# Patient Record
Sex: Female | Born: 1950 | Hispanic: No | Smoking: Never smoker
Health system: Southern US, Community
[De-identification: ages and names within clinical notes are randomized; demographics above are authoritative.]

## PROBLEM LIST (undated history)

## (undated) ENCOUNTER — Emergency Department: Payer: Medicare HMO

## (undated) DIAGNOSIS — M810 Age-related osteoporosis without current pathological fracture: Secondary | ICD-10-CM

## (undated) DIAGNOSIS — F329 Major depressive disorder, single episode, unspecified: Secondary | ICD-10-CM

## (undated) DIAGNOSIS — K219 Gastro-esophageal reflux disease without esophagitis: Secondary | ICD-10-CM

## (undated) DIAGNOSIS — H269 Unspecified cataract: Secondary | ICD-10-CM

## (undated) DIAGNOSIS — F32A Depression, unspecified: Secondary | ICD-10-CM

## (undated) DIAGNOSIS — E039 Hypothyroidism, unspecified: Secondary | ICD-10-CM

## (undated) DIAGNOSIS — E785 Hyperlipidemia, unspecified: Secondary | ICD-10-CM

## (undated) DIAGNOSIS — E079 Disorder of thyroid, unspecified: Secondary | ICD-10-CM

## (undated) HISTORY — PX: TUBAL LIGATION: SHX77

## (undated) HISTORY — PX: HERNIA REPAIR: SHX51

## (undated) HISTORY — PX: COLONOSCOPY: SHX174

## (undated) HISTORY — DX: Hyperlipidemia, unspecified: E78.5

## (undated) HISTORY — DX: Age-related osteoporosis without current pathological fracture: M81.0

---

## 1996-10-28 HISTORY — PX: BREAST BIOPSY: SHX20

## 1998-11-17 ENCOUNTER — Ambulatory Visit (HOSPITAL_COMMUNITY): Admission: RE | Admit: 1998-11-17 | Discharge: 1998-11-17 | Payer: Self-pay | Admitting: General Surgery

## 1998-11-17 ENCOUNTER — Encounter: Payer: Self-pay | Admitting: General Surgery

## 1999-05-29 ENCOUNTER — Encounter: Payer: Self-pay | Admitting: General Surgery

## 1999-05-29 ENCOUNTER — Ambulatory Visit (HOSPITAL_COMMUNITY): Admission: RE | Admit: 1999-05-29 | Discharge: 1999-05-29 | Payer: Self-pay | Admitting: General Surgery

## 2004-09-25 ENCOUNTER — Ambulatory Visit: Payer: Self-pay | Admitting: Internal Medicine

## 2005-10-24 ENCOUNTER — Ambulatory Visit: Payer: Self-pay | Admitting: Internal Medicine

## 2006-02-19 ENCOUNTER — Ambulatory Visit: Payer: Self-pay | Admitting: Obstetrics and Gynecology

## 2006-04-11 ENCOUNTER — Ambulatory Visit: Payer: Self-pay | Admitting: Unknown Physician Specialty

## 2006-11-13 ENCOUNTER — Emergency Department: Payer: Self-pay | Admitting: Unknown Physician Specialty

## 2006-11-14 ENCOUNTER — Other Ambulatory Visit: Payer: Self-pay

## 2007-01-12 ENCOUNTER — Ambulatory Visit: Payer: Self-pay | Admitting: Gastroenterology

## 2008-04-06 ENCOUNTER — Ambulatory Visit: Payer: Self-pay | Admitting: Obstetrics and Gynecology

## 2009-01-08 ENCOUNTER — Emergency Department: Payer: Self-pay

## 2009-04-11 ENCOUNTER — Ambulatory Visit: Payer: Self-pay | Admitting: Unknown Physician Specialty

## 2009-08-16 ENCOUNTER — Ambulatory Visit: Payer: Self-pay | Admitting: Podiatry

## 2010-06-05 ENCOUNTER — Ambulatory Visit: Payer: Self-pay | Admitting: Unknown Physician Specialty

## 2011-07-04 ENCOUNTER — Ambulatory Visit: Payer: Self-pay | Admitting: Unknown Physician Specialty

## 2012-05-06 ENCOUNTER — Ambulatory Visit: Payer: Self-pay | Admitting: Gastroenterology

## 2014-12-01 ENCOUNTER — Ambulatory Visit: Payer: Self-pay | Admitting: Internal Medicine

## 2015-12-13 ENCOUNTER — Other Ambulatory Visit: Payer: Self-pay | Admitting: Internal Medicine

## 2015-12-13 DIAGNOSIS — R1011 Right upper quadrant pain: Secondary | ICD-10-CM

## 2015-12-18 ENCOUNTER — Ambulatory Visit
Admission: RE | Admit: 2015-12-18 | Discharge: 2015-12-18 | Disposition: A | Discharge status: Home / Self Care | Payer: 59 | Source: Ambulatory Visit | Attending: Internal Medicine | Admitting: Internal Medicine

## 2015-12-18 DIAGNOSIS — R1011 Right upper quadrant pain: Secondary | ICD-10-CM | POA: Diagnosis present

## 2016-11-18 ENCOUNTER — Other Ambulatory Visit: Payer: Self-pay | Admitting: Certified Nurse Midwife

## 2016-11-18 DIAGNOSIS — Z1239 Encounter for other screening for malignant neoplasm of breast: Secondary | ICD-10-CM

## 2016-11-18 DIAGNOSIS — Z13820 Encounter for screening for osteoporosis: Secondary | ICD-10-CM

## 2016-12-30 ENCOUNTER — Ambulatory Visit
Admission: RE | Admit: 2016-12-30 | Discharge: 2016-12-30 | Disposition: A | Discharge status: Home / Self Care | Payer: 59 | Source: Ambulatory Visit | Attending: Certified Nurse Midwife | Admitting: Certified Nurse Midwife

## 2016-12-30 ENCOUNTER — Encounter: Payer: Self-pay | Admitting: Radiology

## 2016-12-30 DIAGNOSIS — Z13820 Encounter for screening for osteoporosis: Secondary | ICD-10-CM | POA: Insufficient documentation

## 2016-12-30 DIAGNOSIS — M810 Age-related osteoporosis without current pathological fracture: Secondary | ICD-10-CM

## 2016-12-30 DIAGNOSIS — Z1231 Encounter for screening mammogram for malignant neoplasm of breast: Secondary | ICD-10-CM | POA: Diagnosis not present

## 2016-12-30 DIAGNOSIS — M85851 Other specified disorders of bone density and structure, right thigh: Secondary | ICD-10-CM | POA: Diagnosis not present

## 2016-12-30 DIAGNOSIS — Z1239 Encounter for other screening for malignant neoplasm of breast: Secondary | ICD-10-CM

## 2016-12-30 HISTORY — DX: Age-related osteoporosis without current pathological fracture: M81.0

## 2017-01-01 ENCOUNTER — Encounter: Payer: Self-pay | Admitting: Certified Nurse Midwife

## 2017-01-02 NOTE — Progress Notes (Signed)
Left msg with Dr. Tonette Bihari office to see if Dexa results received. Awaiting response.

## 2017-01-02 NOTE — Progress Notes (Signed)
Contacted Dr. Tonette Bihari office to see if Dexa results were received. Left msg with receptionist to check with nurse. Awaiting response.

## 2017-01-06 NOTE — Progress Notes (Signed)
Thanks [employer]

## 2017-06-05 ENCOUNTER — Emergency Department
Admission: EM | Admit: 2017-06-05 | Discharge: 2017-06-05 | Disposition: A | Discharge status: Home / Self Care | Payer: 59 | Attending: Emergency Medicine | Admitting: Emergency Medicine

## 2017-06-05 ENCOUNTER — Encounter: Payer: Self-pay | Admitting: Medical Oncology

## 2017-06-05 ENCOUNTER — Emergency Department: Payer: 59

## 2017-06-05 DIAGNOSIS — Y999 Unspecified external cause status: Secondary | ICD-10-CM | POA: Insufficient documentation

## 2017-06-05 DIAGNOSIS — S01111A Laceration without foreign body of right eyelid and periocular area, initial encounter: Secondary | ICD-10-CM | POA: Insufficient documentation

## 2017-06-05 DIAGNOSIS — S0990XA Unspecified injury of head, initial encounter: Secondary | ICD-10-CM | POA: Diagnosis present

## 2017-06-05 DIAGNOSIS — S0083XA Contusion of other part of head, initial encounter: Secondary | ICD-10-CM

## 2017-06-05 DIAGNOSIS — S0181XA Laceration without foreign body of other part of head, initial encounter: Secondary | ICD-10-CM

## 2017-06-05 DIAGNOSIS — Y92009 Unspecified place in unspecified non-institutional (private) residence as the place of occurrence of the external cause: Secondary | ICD-10-CM | POA: Insufficient documentation

## 2017-06-05 DIAGNOSIS — S8002XA Contusion of left knee, initial encounter: Secondary | ICD-10-CM | POA: Diagnosis not present

## 2017-06-05 DIAGNOSIS — Y9301 Activity, walking, marching and hiking: Secondary | ICD-10-CM | POA: Diagnosis not present

## 2017-06-05 DIAGNOSIS — W01190A Fall on same level from slipping, tripping and stumbling with subsequent striking against furniture, initial encounter: Secondary | ICD-10-CM | POA: Insufficient documentation

## 2017-06-05 DIAGNOSIS — S01511A Laceration without foreign body of lip, initial encounter: Secondary | ICD-10-CM | POA: Diagnosis not present

## 2017-06-05 HISTORY — DX: Disorder of thyroid, unspecified: E07.9

## 2017-06-05 HISTORY — DX: Depression, unspecified: F32.A

## 2017-06-05 HISTORY — DX: Major depressive disorder, single episode, unspecified: F32.9

## 2017-06-05 HISTORY — DX: Gastro-esophageal reflux disease without esophagitis: K21.9

## 2017-06-05 MED ORDER — TRAMADOL HCL 50 MG PO TABS: 50.0000 mg | ORAL_TABLET | Freq: Once | ORAL | Status: DC

## 2017-06-05 MED ORDER — TRAMADOL HCL 50 MG PO TABS: 50.0000 mg | ORAL_TABLET | Freq: Four times a day (QID) | ORAL | 0 refills | Status: DC | PRN

## 2017-06-05 MED ORDER — IBUPROFEN 600 MG PO TABS: 600.0000 mg | ORAL_TABLET | Freq: Three times a day (TID) | ORAL | 0 refills | Status: DC | PRN

## 2017-06-05 MED ORDER — IBUPROFEN 800 MG PO TABS
800.0000 mg | ORAL_TABLET | Freq: Once | ORAL | Status: AC
  Administered 2017-06-05: 800 mg via ORAL
  Filled 2017-06-05: qty 1

## 2017-06-05 MED ORDER — OXYCODONE-ACETAMINOPHEN 5-325 MG PO TABS
1.0000 | ORAL_TABLET | Freq: Once | ORAL | Status: DC
  Filled 2017-06-05: qty 1

## 2017-06-05 NOTE — ED Provider Notes (Signed)
[facility] Emergency Department Provider Note   ____________________________________________   First MD Initiated Contact with Patient 06/05/17 [phone]     (approximate)  I have reviewed the triage vital signs and the nursing notes.   HISTORY  Chief Complaint Fall    HPI Heather Pacheco is a 66 y.o. female patient complaining of facial pain, right lower periorbital edema/laceration, left knee pain secondary to a slip and fall. Incident occurred this morning, she was taking a dog which slipped on some water. Patient denies LOC, vision disturbance, or vertigo. Patient rates the pain as a 10 over 10. No palliative measures for complaint. Patient described a pain as "achy".   Past Medical History:  Diagnosis Date  . Depression   . GERD (gastroesophageal reflux disease)   . Thyroid disease     There are no active problems to display for this patient.   Past Surgical History:  Procedure Laterality Date  . BREAST BIOPSY Left    Stereotactic biopsy    Prior to Admission medications   Medication Sig Start Date End Date Taking? Authorizing Provider  ibuprofen (ADVIL,MOTRIN) 600 MG tablet Take 1 tablet (600 mg total) by mouth every 8 (eight) hours as needed. 06/05/17   Sable Feil, PA-C  traMADol (ULTRAM) 50 MG tablet Take 1 tablet (50 mg total) by mouth every 6 (six) hours as needed for moderate pain. 06/05/17   Sable Feil, PA-C    Allergies Patient has no known allergies.  Family History  Problem Relation Age of Onset  . Breast cancer Sister 81    Social History Social History  Substance Use Topics  . Smoking status: Not on file  . Smokeless tobacco: Not on file  . Alcohol use Not on file    Review of Systems  Constitutional: No fever/chills Eyes: No visual changes. ENT: No sore throat. Cardiovascular: Denies chest pain. Respiratory: Denies shortness of breath. Gastrointestinal: No abdominal pain.  No nausea, no vomiting.  No  diarrhea.  No constipation. Genitourinary: Negative for dysuria. Musculoskeletal: Left knee pain  Skin: Negative for rash. Right lower periorbital laceration and swelling. Left lower internal lip laceration Neurological: Negative for headaches, focal weakness or numbness. Psychiatric: Depression Endocrine:Hypothyroidism   ____________________________________________   PHYSICAL EXAM:  VITAL SIGNS: ED Triage Vitals  Enc Vitals Group     BP 06/05/17 0729 139/83     Pulse Rate 06/05/17 0729 70     Resp 06/05/17 0729 16     Temp 06/05/17 0729 98.4 F (36.9 C)     Temp Source 06/05/17 0729 Oral     SpO2 06/05/17 0729 97 %     Weight 06/05/17 0730 171 lb (77.6 kg)     Height 06/05/17 0730 5\' 6"  (1.676 m)     Head Circumference --      Peak Flow --      Pain Score 06/05/17 0729 10     Pain Loc --      Pain Edu? --      Excl. in [place]? --     Constitutional: Alert and oriented. Well appearing and in no acute distress. Eyes: Conjunctivae are normal. PERRL. EOMI. Head: Atraumatic. Nose: No congestion/rhinnorhea. Mouth/Throat: Mucous membranes are moist.  Oropharynx non-erythematous. Lower inner lip laceration Neck: No stridor.  No cervical spine tenderness to palpation. Cardiovascular: Normal rate, regular rhythm. Grossly normal heart sounds.  Good peripheral circulation. Respiratory: Normal respiratory effort.  No retractions. Lungs CTAB. Musculoskeletal: No lower extremity tenderness nor  edema.  No joint effusions. Neurologic:  Normal speech and language. No gross focal neurologic deficits are appreciated. No gait instability. Skin:  Skin is warm, dry and intact. No rash noted. Psychiatric: Mood and affect are normal. Speech and behavior are normal.  ____________________________________________   LABS (all labs ordered are listed, but only abnormal results are displayed)  Labs Reviewed - No data to  display ____________________________________________  EKG   ____________________________________________  RADIOLOGY  Dg Facial Bones Complete  Result Date: 06/05/2017 CLINICAL DATA:  Pain following fall EXAM: FACIAL BONES COMPLETE 3+V COMPARISON:  None. FINDINGS: Delsa Sale, lateral, and submentaovertex images were obtained. There is no demonstrable fracture or dislocation. Paranasal sinuses and mastoids appear clear. There is minimal leftward deviation of the upper nasal septum. IMPRESSION: Minimal leftward deviation of the upper nasal septum. No evident fracture or dislocation. Paranasal sinuses and mastoids are clear. If there is concern for intracranial pathology, noncontrast enhanced head CT would be the imaging study of choice for further assessment. Electronically Signed   By: Lowella Grip III M.D.   On: 06/05/2017 09:02   Dg Knee Complete 4 Views Left  Result Date: 06/05/2017 CLINICAL DATA:  Pain following fall EXAM: LEFT KNEE - COMPLETE 4+ VIEW COMPARISON:  None. FINDINGS: Frontal, lateral common bile oblique views were obtained. There is marked soft tissue prominence anterior to the patella. No soft tissue air in this area. There is no evident fracture or dislocation. There is no appreciable joint effusion. There is mild narrowing of the patellofemoral joint. Other joint spaces appear normal. No erosive change. IMPRESSION: Marked soft tissue prominence anterior to the patella. Suspect hematoma in this area. No soft tissue air. No fracture or dislocation. No joint effusion. Mild narrowing of the patellofemoral joint noted. Electronically Signed   By: Lowella Grip III M.D.   On: 06/05/2017 09:03    _X-ray of the left knee remarkable for hematoma. ___________________________________________   PROCEDURES  Procedure(s) performed: None  Procedures  Critical Care performed: No  ____________________________________________   INITIAL IMPRESSION / ASSESSMENT AND PLAN /  ED COURSE  Pertinent labs & imaging results that were available during my care of the patient were reviewed by me and considered in my medical decision making (see chart for details).  Facial contusion and laceration. Left inner lip laceration. Left knee hematoma secondary to contusion. Discussed x-ray findings with patient. Patient given discharge care instructions. Patient advised take medication as needed. Patient given a work note. Patient advised to follow with PCP if condition persists.      ____________________________________________   FINAL CLINICAL IMPRESSION(S) / ED DIAGNOSES  Final diagnoses:  Contusion of face, initial encounter  Facial laceration, initial encounter  Lip laceration, initial encounter  Traumatic hematoma of left knee, initial encounter      NEW MEDICATIONS STARTED DURING THIS VISIT:  New Prescriptions   IBUPROFEN (ADVIL,MOTRIN) 600 MG TABLET    Take 1 tablet (600 mg total) by mouth every 8 (eight) hours as needed.   TRAMADOL (ULTRAM) 50 MG TABLET    Take 1 tablet (50 mg total) by mouth every 6 (six) hours as needed for moderate pain.     Note:  This document was prepared using Dragon voice recognition software and may include unintentional dictation errors.    Sable Feil, PA-C 06/05/17 4742    Nance Pear, MD 06/05/17 [phone]

## 2017-06-05 NOTE — ED Notes (Signed)
Pt to ed with c/o fall this am at home. Pt reports she slipped on water on the floor and then her face hit the table, and left knee hit the floor.  Small scratch/abrasion to right eye and left lower lip swelling.  Also swelling noted to left knee.

## 2017-06-05 NOTE — Discharge Instructions (Signed)
Final discharge care instructions. Innerr lip laceration did not require sutures. Advised warm salt water gargles 2-3 times a day.

## 2017-06-05 NOTE — ED Notes (Signed)
Pt verbalizes understanding of discharge instruction and follow up.

## 2017-06-05 NOTE — ED Triage Notes (Signed)
Pt ambulatory to triage with reports that she got up this am to take her dog out and slipped on some water. Pt hit her rt eye, lower lip and left knee. Denies LOC. No use of blood thinners.

## 2017-11-06 ENCOUNTER — Ambulatory Visit: Admit: 2017-11-06 | Payer: 59 | Admitting: Gastroenterology

## 2017-11-06 SURGERY — COLONOSCOPY WITH PROPOFOL
Anesthesia: General

## 2018-01-02 ENCOUNTER — Encounter: Payer: Self-pay | Admitting: Certified Nurse Midwife

## 2018-01-02 ENCOUNTER — Ambulatory Visit (INDEPENDENT_AMBULATORY_CARE_PROVIDER_SITE_OTHER): Payer: Medicare HMO | Admitting: Certified Nurse Midwife

## 2018-01-02 VITALS — BP 112/66 | HR 67 | Ht 65.0 in | Wt 177.0 lb

## 2018-01-02 DIAGNOSIS — Z1231 Encounter for screening mammogram for malignant neoplasm of breast: Secondary | ICD-10-CM | POA: Diagnosis not present

## 2018-01-02 DIAGNOSIS — K219 Gastro-esophageal reflux disease without esophagitis: Secondary | ICD-10-CM | POA: Insufficient documentation

## 2018-01-02 DIAGNOSIS — E079 Disorder of thyroid, unspecified: Secondary | ICD-10-CM | POA: Insufficient documentation

## 2018-01-02 DIAGNOSIS — Z01419 Encounter for gynecological examination (general) (routine) without abnormal findings: Secondary | ICD-10-CM | POA: Diagnosis not present

## 2018-01-02 DIAGNOSIS — E785 Hyperlipidemia, unspecified: Secondary | ICD-10-CM | POA: Insufficient documentation

## 2018-01-02 DIAGNOSIS — F329 Major depressive disorder, single episode, unspecified: Secondary | ICD-10-CM | POA: Insufficient documentation

## 2018-01-02 DIAGNOSIS — Z1239 Encounter for other screening for malignant neoplasm of breast: Secondary | ICD-10-CM

## 2018-01-02 DIAGNOSIS — Z124 Encounter for screening for malignant neoplasm of cervix: Secondary | ICD-10-CM

## 2018-01-02 DIAGNOSIS — F32A Depression, unspecified: Secondary | ICD-10-CM | POA: Insufficient documentation

## 2018-01-02 NOTE — Progress Notes (Signed)
Gynecology Annual Exam  PCP: Kirk Ruths, MD  Chief Complaint:  Chief Complaint  Patient presents with  . Gynecologic Exam    History of Present Illness:Heather Pacheco is a 67 year old Caucasian/White female, G4 P2022, who presents for her annual exam. She is having no significant GYN problems.  Her menses are absent and she is postmenopausal. She does not have hot flashes.  She has had no spotting.   The patient's past medical history is notable for a history of depression, hypothyroidism, hyperlipidemia, and GERD.  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 She has also retired from her [employer] job.  She is not sexually active. She has been sexually active in the past but not currently.  Her most recent pap smear was obtained 11/18/2016 and was NIL Her most recent mammogram obtained on 12/30/2016 was normal and revealed no significant changes.  There is a family history of breast cancer in her sister. Genetic testing has not been done There is no family history of ovarian cancer.  The patient does do monthly self breast exams.  She had a colonoscopy November 2018 ago that was normal. Her next colonoscopy is due in 5 years.   She had a recent DEXA scan 12/30/2016 revealing osteoporosis of the spine with a T score of -2.7. Her PCP Dr Ouida Sills, her PCP, placed her on Boniva.  The patient does not smoke.  The patient does not drink alcohol.  The patient does not use illegal drugs.  The patient exercises 3x/[facility] at the [facility]. The patient does get adequate calcium in her diet.  She had a recent cholesterol screen in 2019 that was abnormal. She had an elevated TC and LDL.    Review of Systems: Review of Systems  Constitutional: Negative for chills, fever and weight loss.  HENT: Negative for congestion, sinus pain and sore throat.        Positive for sneezing  Eyes: Negative for blurred vision and pain.    Respiratory: Negative for hemoptysis, shortness of breath and wheezing.   Cardiovascular: Negative for chest pain, palpitations and leg swelling.  Gastrointestinal: Negative for abdominal pain, blood in stool, diarrhea, heartburn, nausea and vomiting.  Genitourinary: Negative for dysuria, frequency, hematuria and urgency.  Musculoskeletal: Negative for back pain, joint pain and myalgias.  Skin: Negative for itching and rash.  Neurological: Negative for dizziness, tingling and headaches.  Endo/Heme/Allergies: Positive for environmental allergies. Negative for polydipsia. Does not bruise/bleed easily.       Negative for hirsutism   Psychiatric/Behavioral: Positive for depression. The patient is not nervous/anxious and does not have insomnia.     Past Medical History:  Past Medical History:  Diagnosis Date  . Depression   . GERD (gastroesophageal reflux disease)   . Hyperlipidemia   . 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 Thyroid disease     Past Surgical History:  Past Surgical History:  Procedure Laterality Date  . BREAST BIOPSY Left 1998   Stereotactic biopsy benign  . COLONOSCOPY  2008; 08/2017   normal colon   . TUBAL LIGATION      Family History:  Family History  Problem Relation Age of Onset  . Breast cancer Sister 74  . Colon cancer Mother 58       rectal cancer  . Hypertension Mother   . Diabetes Maternal Aunt 40    Social History:  Social History   Socioeconomic History  . Marital status: Divorced    Spouse name: Not on file  . Number of children: 2  . Years of education: Not on file  . Highest education level: Not on file  Social Needs  . Financial resource strain: Not on file  . Food insecurity - worry: Not on file  . Food insecurity - inability: Not on file  . Transportation needs - medical: Not on file  . Transportation needs - non-medical: Not on file  Occupational History  . Occupation: Retired from [employer] 2019  Tobacco Use   . Smoking status: Never Smoker  . Smokeless tobacco: Never Used  Substance and Sexual Activity  . Alcohol use: No    Frequency: Never  . Drug use: No  . Sexual activity: Not Currently    Birth control/protection: Post-menopausal  Other Topics Concern  . Not on file  Social History Narrative  . Not on file    Allergies:  No Known Allergies  Medications:  Current Outpatient Medications on File Prior to Visit  Medication Sig Dispense Refill  . azelastine (OPTIVAR) 0.05 % ophthalmic solution INSTILL ONE DROP IN BOTH  EYES TWO TIMES DAILY    . cetirizine (ZYRTEC) 10 MG tablet Take by mouth.    . chlorhexidine (PERIDEX) 0.12 % solution     . Cholecalciferol (VITAMIN D) 2000 units tablet Take by mouth.    . citalopram (CELEXA) 20 MG tablet Take 30 mg by mouth daily.     . fluticasone (FLONASE) 50 MCG/ACT nasal spray     . ibandronate (BONIVA) 150 MG tablet TAKE 1 TABLET EVERY 30  DAYS. TAKE WITH A FULL  GLASS OF WATER IN THE  MORNING . DO NOT Pacheco DOWN  FOR THE NEXT 60 MIN.    Marland Kitchen ibuprofen (ADVIL,MOTRIN) 600 MG tablet Take 1 tablet (600 mg total) by mouth every 8 (eight) hours as needed. 15 tablet 0  . levothyroxine (SYNTHROID, LEVOTHROID) 88 MCG tablet     . Multiple Vitamin (MULTIVITAMIN) tablet Take 1 tablet by mouth daily.    Marland Kitchen omeprazole (PRILOSEC) 20 MG capsule TAKE 1 CAPSULE BY MOUTH  ONCE A DAY    . PREVNAR 13 SUSP injection ADM 0.5ML IM UTD  0   No current facility-administered medications on file prior to visit.    Physical Exam Vitals: BP 112/66   Pulse 67   Ht 5\' 5"  (1.651 m)   Wt 177 lb (80.3 kg)   BMI 29.45 kg/m    General: WF in NAD HEENT: normocephalic, anicteric Neck: no thyroid enlargement, no palpable nodules, no cervical lymphadenopathy  Pulmonary: No increased work of breathing, CTAB Cardiovascular: RRR, without murmur  Breast: Breast symmetrical, no tenderness, no palpable nodules or masses, no skin or nipple retraction present, no nipple discharge.  No  axillary, infraclavicular or supraclavicular lymphadenopathy. Abdomen: Soft, non-tender, non-distended.  Umbilicus without lesions.  No hepatomegaly or masses palpable. No evidence of hernia. Genitourinary:  External: Normal external female genitalia.  Normal urethral meatus, normal Bartholin's and Skene's glands.    Vagina: Normal vaginal mucosa, no evidence of prolapse.    Cervix: Grossly normal in appearance, no bleeding, non-tender  Uterus: Retroverted, normal size, shape, and consistency, mobile, and non-tender  Adnexa: No adnexal masses, non-tender  Rectal: deferred  Lymphatic: no evidence of inguinal lymphadenopathy Extremities: no edema, erythema, or tenderness Neurologic: Grossly intact Psychiatric: mood appropriate, affect full     Assessment: 67 y.o. annual gyn exam  Plan:  1) Breast cancer screening - recommend monthly self breast exam and annual screening mammogram. Mammogram was ordered today.  2) Colon cancer screening: colonoscopy 08/2017 was normal  3) Cervical cancer screening - Pap was done. ASCCP guidelines and rational discussed.  Patient opts for every 2 years screening interval   4) Osteoporosis of spine: recommend repeat Dexa next year. Continue Boniva/ vitamin D3 supplements and exercise.  5) Routine healthcare maintenance including cholesterol and diabetes screening managed by PCP  Dalia Heading, CNM

## 2018-01-05 LAB — PAP IG (IMAGE GUIDED): PAP Smear Comment: 0

## 2018-01-23 ENCOUNTER — Ambulatory Visit
Admission: RE | Admit: 2018-01-23 | Discharge: 2018-01-23 | Disposition: A | Discharge status: Home / Self Care | Payer: Medicare HMO | Source: Ambulatory Visit | Attending: Certified Nurse Midwife | Admitting: Certified Nurse Midwife

## 2018-01-23 DIAGNOSIS — Z1231 Encounter for screening mammogram for malignant neoplasm of breast: Secondary | ICD-10-CM | POA: Insufficient documentation

## 2018-01-23 DIAGNOSIS — Z1239 Encounter for other screening for malignant neoplasm of breast: Secondary | ICD-10-CM

## 2018-04-28 ENCOUNTER — Telehealth: Payer: Self-pay

## 2018-04-28 NOTE — Telephone Encounter (Signed)
PLease have her call her PCP. Dr Ouida Sills started her on the Boniva.

## 2018-04-28 NOTE — Telephone Encounter (Signed)
boniva rx too expensive, please advise for alternative. Thank you!

## 2018-04-28 NOTE — Telephone Encounter (Signed)
Pt states the Ibangronate cost too much its 300 dollars. Is there anything else that could be called in?

## 2018-04-29 NOTE — Telephone Encounter (Signed)
Pt aware and plans to contact PCP

## 2019-03-18 ENCOUNTER — Other Ambulatory Visit: Payer: Self-pay | Admitting: Internal Medicine

## 2019-03-18 DIAGNOSIS — Z1231 Encounter for screening mammogram for malignant neoplasm of breast: Secondary | ICD-10-CM

## 2019-04-05 ENCOUNTER — Other Ambulatory Visit: Payer: Self-pay

## 2019-04-05 ENCOUNTER — Ambulatory Visit
Admission: RE | Admit: 2019-04-05 | Discharge: 2019-04-05 | Disposition: A | Discharge status: Home / Self Care | Payer: Medicare HMO | Source: Ambulatory Visit | Attending: Internal Medicine | Admitting: Internal Medicine

## 2019-04-05 DIAGNOSIS — Z1231 Encounter for screening mammogram for malignant neoplasm of breast: Secondary | ICD-10-CM | POA: Diagnosis present

## 2019-07-15 ENCOUNTER — Other Ambulatory Visit: Payer: Self-pay

## 2019-07-15 ENCOUNTER — Encounter: Payer: Self-pay | Admitting: *Deleted

## 2019-07-15 NOTE — Discharge Instructions (Signed)

## 2019-07-16 ENCOUNTER — Other Ambulatory Visit
Admission: RE | Admit: 2019-07-16 | Discharge: 2019-07-16 | Disposition: A | Discharge status: Home / Self Care | Payer: Medicare HMO | Source: Ambulatory Visit | Attending: Ophthalmology | Admitting: Ophthalmology

## 2019-07-16 DIAGNOSIS — Z20828 Contact with and (suspected) exposure to other viral communicable diseases: Secondary | ICD-10-CM | POA: Diagnosis not present

## 2019-07-16 DIAGNOSIS — Z01812 Encounter for preprocedural laboratory examination: Secondary | ICD-10-CM | POA: Insufficient documentation

## 2019-07-17 LAB — SARS CORONAVIRUS 2 (TAT 6-24 HRS): SARS Coronavirus 2: NEGATIVE

## 2019-07-19 NOTE — Anesthesia Preprocedure Evaluation (Addendum)
Anesthesia Evaluation  Patient identified by MRN, date of birth, ID band Patient awake    Reviewed: Allergy & Precautions, NPO status , Patient's Chart, lab work & pertinent test results  History of Anesthesia Complications Negative for: history of anesthetic complications  Airway Mallampati: I   Neck ROM: Full    Dental no notable dental hx.    Pulmonary neg pulmonary ROS,    Pulmonary exam normal breath sounds clear to auscultation       Cardiovascular Exercise Tolerance: Good negative cardio ROS Normal cardiovascular exam Rhythm:Regular Rate:Normal     Neuro/Psych PSYCHIATRIC DISORDERS Depression negative neurological ROS     GI/Hepatic GERD  ,  Endo/Other  Hypothyroidism   Renal/GU negative Renal ROS     Musculoskeletal   Abdominal   Peds  Hematology negative hematology ROS (+)   Anesthesia Other Findings   Reproductive/Obstetrics                            Anesthesia Physical Anesthesia Plan  ASA: II  Anesthesia Plan: MAC   Post-op Pain Management:    Induction: Intravenous  PONV Risk Score and Plan: 2 and TIVA  Airway Management Planned: Natural Airway  Additional Equipment:   Intra-op Plan:   Post-operative Plan:   Informed Consent: I have reviewed the patients History and Physical, chart, labs and discussed the procedure including the risks, benefits and alternatives for the proposed anesthesia with the patient or authorized representative who has indicated his/her understanding and acceptance.       Plan Discussed with: CRNA  Anesthesia Plan Comments:        Anesthesia Quick Evaluation

## 2019-07-21 ENCOUNTER — Ambulatory Visit
Admission: RE | Admit: 2019-07-21 | Discharge: 2019-07-21 | Disposition: A | Discharge status: Home / Self Care | Payer: Medicare HMO | Attending: Ophthalmology | Admitting: Ophthalmology

## 2019-07-21 ENCOUNTER — Ambulatory Visit: Payer: Medicare HMO | Admitting: Anesthesiology

## 2019-07-21 ENCOUNTER — Other Ambulatory Visit: Payer: Self-pay

## 2019-07-21 ENCOUNTER — Encounter
Admission: RE | Disposition: A | Discharge status: Home / Self Care | Payer: Self-pay | Source: Home / Self Care | Attending: Ophthalmology

## 2019-07-21 DIAGNOSIS — E039 Hypothyroidism, unspecified: Secondary | ICD-10-CM | POA: Insufficient documentation

## 2019-07-21 DIAGNOSIS — K219 Gastro-esophageal reflux disease without esophagitis: Secondary | ICD-10-CM | POA: Insufficient documentation

## 2019-07-21 DIAGNOSIS — H2512 Age-related nuclear cataract, left eye: Secondary | ICD-10-CM | POA: Insufficient documentation

## 2019-07-21 DIAGNOSIS — E7800 Pure hypercholesterolemia, unspecified: Secondary | ICD-10-CM | POA: Diagnosis not present

## 2019-07-21 DIAGNOSIS — Z79899 Other long term (current) drug therapy: Secondary | ICD-10-CM | POA: Diagnosis not present

## 2019-07-21 DIAGNOSIS — M7989 Other specified soft tissue disorders: Secondary | ICD-10-CM | POA: Insufficient documentation

## 2019-07-21 DIAGNOSIS — M810 Age-related osteoporosis without current pathological fracture: Secondary | ICD-10-CM | POA: Diagnosis not present

## 2019-07-21 DIAGNOSIS — F329 Major depressive disorder, single episode, unspecified: Secondary | ICD-10-CM | POA: Insufficient documentation

## 2019-07-21 HISTORY — PX: CATARACT EXTRACTION W/PHACO: SHX586

## 2019-07-21 SURGERY — PHACOEMULSIFICATION, CATARACT, WITH IOL INSERTION
Anesthesia: Monitor Anesthesia Care | Site: Eye | Laterality: Left

## 2019-07-21 MED ORDER — EPINEPHRINE PF 1 MG/ML IJ SOLN
INTRAOCULAR | Status: DC | PRN
  Administered 2019-07-21: 60 mL via OPHTHALMIC

## 2019-07-21 MED ORDER — ONDANSETRON HCL 4 MG/2ML IJ SOLN: 4.0000 mg | Freq: Once | INTRAMUSCULAR | Status: DC | PRN

## 2019-07-21 MED ORDER — MIDAZOLAM HCL 2 MG/2ML IJ SOLN
INTRAMUSCULAR | Status: DC | PRN
  Administered 2019-07-21: 2 mg via INTRAVENOUS

## 2019-07-21 MED ORDER — CEFUROXIME OPHTHALMIC INJECTION 1 MG/0.1 ML
INJECTION | OPHTHALMIC | Status: DC | PRN
  Administered 2019-07-21: 0.1 mL via INTRACAMERAL

## 2019-07-21 MED ORDER — ACETAMINOPHEN 10 MG/ML IV SOLN: 1000.0000 mg | Freq: Once | INTRAVENOUS | Status: DC | PRN

## 2019-07-21 MED ORDER — TETRACAINE HCL 0.5 % OP SOLN
1.0000 [drp] | OPHTHALMIC | Status: DC | PRN
  Administered 2019-07-21 (×3): 1 [drp] via OPHTHALMIC

## 2019-07-21 MED ORDER — ARMC OPHTHALMIC DILATING DROPS
1.0000 "application " | OPHTHALMIC | Status: DC | PRN
  Administered 2019-07-21 (×3): 1 via OPHTHALMIC

## 2019-07-21 MED ORDER — MOXIFLOXACIN HCL 0.5 % OP SOLN
1.0000 [drp] | OPHTHALMIC | Status: DC | PRN
  Administered 2019-07-21 (×3): 1 [drp] via OPHTHALMIC

## 2019-07-21 MED ORDER — FENTANYL CITRATE (PF) 100 MCG/2ML IJ SOLN
INTRAMUSCULAR | Status: DC | PRN
  Administered 2019-07-21: 50 ug via INTRAVENOUS

## 2019-07-21 MED ORDER — LIDOCAINE HCL (PF) 2 % IJ SOLN
INTRAOCULAR | Status: DC | PRN
  Administered 2019-07-21: 1 mL

## 2019-07-21 MED ORDER — BRIMONIDINE TARTRATE-TIMOLOL 0.2-0.5 % OP SOLN
OPHTHALMIC | Status: DC | PRN
  Administered 2019-07-21: 1 [drp] via OPHTHALMIC

## 2019-07-21 MED ORDER — NA HYALUR & NA CHOND-NA HYALUR 0.4-0.35 ML IO KIT
PACK | INTRAOCULAR | Status: DC | PRN
  Administered 2019-07-21: 1 mL via INTRAOCULAR

## 2019-07-21 SURGICAL SUPPLY — 16 items
CANNULA ANT/CHMB 27G (MISCELLANEOUS) ×1 IMPLANT
CANNULA ANT/CHMB 27GA (MISCELLANEOUS) ×2 IMPLANT
GLOVE SURG LX 7.5 STRW (GLOVE) ×1
GLOVE SURG LX STRL 7.5 STRW (GLOVE) ×1 IMPLANT
GLOVE SURG TRIUMPH 8.0 PF LTX (GLOVE) ×2 IMPLANT
GOWN STRL REUS W/ TWL LRG LVL3 (GOWN DISPOSABLE) ×2 IMPLANT
GOWN STRL REUS W/TWL LRG LVL3 (GOWN DISPOSABLE) ×2
LENS IOL TECNIS ITEC 20.5 (Intraocular Lens) ×1 IMPLANT
MARKER SKIN DUAL TIP RULER LAB (MISCELLANEOUS) ×2 IMPLANT
PACK CATARACT BRASINGTON (MISCELLANEOUS) ×2 IMPLANT
PACK EYE AFTER SURG (MISCELLANEOUS) ×2 IMPLANT
PACK OPTHALMIC (MISCELLANEOUS) ×2 IMPLANT
SYR 3ML LL SCALE MARK (SYRINGE) ×2 IMPLANT
SYR TB 1ML LUER SLIP (SYRINGE) ×2 IMPLANT
WATER STERILE IRR 500ML POUR (IV SOLUTION) ×2 IMPLANT
WIPE NON LINTING 3.25X3.25 (MISCELLANEOUS) ×2 IMPLANT

## 2019-07-21 NOTE — Anesthesia Postprocedure Evaluation (Signed)
Anesthesia Post Note  Patient: Heather Pacheco  Procedure(s) Performed: CATARACT EXTRACTION PHACO AND INTRAOCULAR LENS PLACEMENT (IOC) LEFT  00:50.7  11.0%  5.60 (Left Eye)  Patient location during evaluation: PACU Anesthesia Type: MAC Level of consciousness: awake and alert, oriented and patient cooperative Pain management: pain level controlled Vital Signs Assessment: post-procedure vital signs reviewed and stable Respiratory status: spontaneous breathing, nonlabored ventilation and respiratory function stable Cardiovascular status: blood pressure returned to baseline and stable Postop Assessment: adequate PO intake Anesthetic complications: no    Darrin Nipper

## 2019-07-21 NOTE — Op Note (Signed)
OPERATIVE NOTE  Heather Pacheco [id number] 07/21/2019   PREOPERATIVE DIAGNOSIS:  Nuclear sclerotic cataract left eye. H25.12   POSTOPERATIVE DIAGNOSIS:    Nuclear sclerotic cataract left eye.     PROCEDURE:  Phacoemusification with posterior chamber intraocular lens placement of the left eye  Ultrasound time: Procedure(s) with comments: CATARACT EXTRACTION PHACO AND INTRAOCULAR LENS PLACEMENT (IOC) LEFT  00:50.7  11.0%  5.60 (Left) - LEAVE PATIENT 3RD BECAUSE OF TRANSPORTATION  LENS:   Implant Name Type Inv. Item Serial No. Manufacturer Lot No. LRB No. Used Action  LENS IOL DIOP 20.5 - [id number] Intraocular Lens LENS IOL DIOP 20.5 [id number]  Left 1 Implanted      SURGEON:  Wyonia Hough, MD   ANESTHESIA:  Topical with tetracaine drops and 2% Xylocaine jelly, augmented with 1% preservative-free intracameral lidocaine.    COMPLICATIONS:  None.   DESCRIPTION OF PROCEDURE:  The patient was identified in the holding room and transported to the operating room and placed in the supine position under the operating microscope.  The left eye was identified as the operative eye and it was prepped and draped in the usual sterile ophthalmic fashion.   A 1 millimeter clear-corneal paracentesis was made at the 1:30 position.  0.5 ml of preservative-free 1% lidocaine was injected into the anterior chamber.  The anterior chamber was filled with Viscoat viscoelastic.  A 2.4 millimeter keratome was used to make a near-clear corneal incision at the 10:30 position.  .  A curvilinear capsulorrhexis was made with a cystotome and capsulorrhexis forceps.  Balanced salt solution was used to hydrodissect and hydrodelineate the nucleus.   Phacoemulsification was then used in stop and chop fashion to remove the lens nucleus and epinucleus.  The remaining cortex was then removed using the irrigation and aspiration handpiece. Provisc was then placed into the capsular bag to distend it for lens  placement.  A lens was then injected into the capsular bag.  The remaining viscoelastic was aspirated.   Wounds were hydrated with balanced salt solution.  The anterior chamber was inflated to a physiologic pressure with balanced salt solution.  No wound leaks were noted. Cefuroxime 0.1 ml of a 10mg /ml solution was injected into the anterior chamber for a dose of 1 mg of intracameral antibiotic at the completion of the case.   Timolol and Brimonidine drops were applied to the eye.  The patient was taken to the recovery room in stable condition without complications of anesthesia or surgery.  [name] 07/21/2019, 8:56 AM

## 2019-07-21 NOTE — H&P (Signed)

## 2019-07-21 NOTE — Transfer of Care (Signed)
Immediate Anesthesia Transfer of Care Note  Patient: Heather Pacheco  Procedure(s) Performed: CATARACT EXTRACTION PHACO AND INTRAOCULAR LENS PLACEMENT (IOC) LEFT  00:50.7  11.0%  5.60 (Left Eye)  Patient Location: PACU  Anesthesia Type: MAC  Level of Consciousness: awake, alert  and patient cooperative  Airway and Oxygen Therapy: Patient Spontanous Breathing and Patient connected to supplemental oxygen  Post-op Assessment: Post-op Vital signs reviewed, Patient's Cardiovascular Status Stable, Respiratory Function Stable, Patent Airway and No signs of Nausea or vomiting  Post-op Vital Signs: Reviewed and stable  Complications: No apparent anesthesia complications

## 2019-09-15 ENCOUNTER — Ambulatory Visit: Admit: 2019-09-15 | Payer: Medicare HMO | Admitting: Ophthalmology

## 2019-09-15 SURGERY — PHACOEMULSIFICATION, CATARACT, WITH IOL INSERTION
Anesthesia: Topical | Laterality: Right

## 2019-12-06 ENCOUNTER — Ambulatory Visit: Payer: Medicare HMO | Attending: Internal Medicine

## 2019-12-06 DIAGNOSIS — Z23 Encounter for immunization: Secondary | ICD-10-CM | POA: Insufficient documentation

## 2019-12-06 NOTE — Progress Notes (Signed)
   Covid-19 Vaccination Clinic  Name:  Heather Pacheco    MRN: [id number] DOB: 1951-02-16  12/06/2019  [name] was observed post Covid-19 immunization for 15 minutes without incidence. She was provided with Vaccine Information Sheet and instruction to access the V-Safe system.   Ms. August was instructed to call 911 with any severe reactions post vaccine: Marland Kitchen Difficulty breathing  . Swelling of your face and throat  . A fast heartbeat  . A bad rash all over your body  . Dizziness and weakness    Immunizations Administered    Name Date Dose VIS Date Route   Pfizer COVID-19 Vaccine 12/06/2019 10:53 AM 0.3 mL 10/08/2019 Intramuscular   Manufacturer: Home   Lot: [id number]   [place]: [id number]

## 2019-12-31 ENCOUNTER — Ambulatory Visit: Payer: Medicare HMO

## 2020-02-23 ENCOUNTER — Encounter: Payer: Self-pay | Admitting: Ophthalmology

## 2020-02-23 ENCOUNTER — Other Ambulatory Visit: Payer: Self-pay

## 2020-02-26 ENCOUNTER — Encounter: Payer: Self-pay | Admitting: Emergency Medicine

## 2020-02-26 ENCOUNTER — Other Ambulatory Visit: Payer: Self-pay

## 2020-02-26 ENCOUNTER — Ambulatory Visit
Admission: EM | Admit: 2020-02-26 | Discharge: 2020-02-26 | Disposition: A | Discharge status: Home / Self Care | Payer: Medicare HMO | Attending: Family Medicine | Admitting: Family Medicine

## 2020-02-26 DIAGNOSIS — E785 Hyperlipidemia, unspecified: Secondary | ICD-10-CM | POA: Diagnosis not present

## 2020-02-26 DIAGNOSIS — M810 Age-related osteoporosis without current pathological fracture: Secondary | ICD-10-CM | POA: Diagnosis not present

## 2020-02-26 DIAGNOSIS — Z79890 Hormone replacement therapy (postmenopausal): Secondary | ICD-10-CM | POA: Diagnosis not present

## 2020-02-26 DIAGNOSIS — F329 Major depressive disorder, single episode, unspecified: Secondary | ICD-10-CM | POA: Diagnosis not present

## 2020-02-26 DIAGNOSIS — J069 Acute upper respiratory infection, unspecified: Secondary | ICD-10-CM | POA: Diagnosis not present

## 2020-02-26 DIAGNOSIS — Z79899 Other long term (current) drug therapy: Secondary | ICD-10-CM | POA: Diagnosis not present

## 2020-02-26 DIAGNOSIS — E039 Hypothyroidism, unspecified: Secondary | ICD-10-CM | POA: Insufficient documentation

## 2020-02-26 DIAGNOSIS — Z20822 Contact with and (suspected) exposure to covid-19: Secondary | ICD-10-CM | POA: Insufficient documentation

## 2020-02-26 DIAGNOSIS — K219 Gastro-esophageal reflux disease without esophagitis: Secondary | ICD-10-CM | POA: Diagnosis not present

## 2020-02-26 HISTORY — DX: Unspecified cataract: H26.9

## 2020-02-26 NOTE — ED Triage Notes (Signed)
Patient c/o runny nose, cough and bodyaches for the past week.

## 2020-02-26 NOTE — ED Provider Notes (Signed)
[facility]    CSN: [id number] Arrival date & time: 02/26/20  1545      History   Chief Complaint Chief Complaint  Patient presents with  . Generalized Body Aches  . Nasal Congestion    HPI Heather Pacheco is a 69 y.o. female.   HPI  69 year old female presents with a 8-day history of runny nose cough and body aches.  Is very fatigued.   States that her cough is mild but her most bothersome symptom is that of fatigue.  She is afebrile.  O2 sats are 96% on room air pulse rate 61 blood pressure 130/83.  She was vaccinated for COVID-19 with her first received in February and her second in March.     Past Medical History:  Diagnosis Date  . Cataract   . Depression   . GERD (gastroesophageal reflux disease)   . Hyperlipidemia   . Hypothyroidism   . 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 Thyroid disease     Patient Active Problem List   Diagnosis Date Noted  . Thyroid disease   . Hyperlipidemia   . GERD (gastroesophageal reflux disease)   . Depression   . Osteoporosis 12/30/2016    Past Surgical History:  Procedure Laterality Date  . BREAST BIOPSY Left 1998   Stereotactic biopsy benign  . CATARACT EXTRACTION W/PHACO Left 07/21/2019   Procedure: CATARACT EXTRACTION PHACO AND INTRAOCULAR LENS PLACEMENT (IOC) LEFT  00:50.7  11.0%  5.60;  Surgeon: Leandrew Koyanagi, MD;  Location: [place];  Service: Ophthalmology;  Laterality: Left;  LEAVE PATIENT 3RD BECAUSE OF TRANSPORTATION  . COLONOSCOPY  2008; 08/2017   normal colon   . HERNIA REPAIR     umbilical  . TUBAL LIGATION      OB History    Gravida  4   Para  2   Term  2   Preterm      AB  2   Living  2     SAB  2   TAB      Ectopic      Multiple      Live Births  2            Home Medications    Prior to Admission medications   Medication Sig Start Date End Date Taking? Authorizing Provider  alendronate (FOSAMAX) 70 MG tablet Take 70 mg by  mouth once a week. Take with a full glass of water on an empty stomach.   Yes [provider]  azelastine (OPTIVAR) 0.05 % ophthalmic solution 1 drop 2 (two) times daily.   Yes [provider]  Cholecalciferol (VITAMIN D) 2000 units tablet Take by mouth.   Yes [provider]  citalopram (CELEXA) 20 MG tablet Take 30 mg by mouth daily.  12/16/17  Yes [provider]  fluticasone Asencion Islam) 50 MCG/ACT nasal spray  12/16/17  Yes [provider]  levothyroxine (SYNTHROID, LEVOTHROID) 88 MCG tablet  12/16/17  Yes [provider]  Multiple Vitamin (MULTIVITAMIN) tablet Take 1 tablet by mouth daily.   Yes [provider]  omeprazole (PRILOSEC) 20 MG capsule TAKE 1 CAPSULE BY MOUTH  ONCE A DAY 02/19/17  Yes [provider]  chlorhexidine (PERIDEX) 0.12 % solution  12/19/17   [provider]  PREVNAR 13 SUSP injection ADM 0.5ML IM UTD 12/19/17   [provider]    Family History Family History  Problem Relation Age of Onset  .  Breast cancer Sister 68  . Colon cancer Mother 49       rectal cancer  . Hypertension Mother   . Diabetes Maternal Aunt 67    Social History Social History   Tobacco Use  . Smoking status: Never Smoker  . Smokeless tobacco: Never Used  Substance Use Topics  . Alcohol use: No    Comment: Rare   . Drug use: No     Allergies   Patient has no known allergies.   Review of Systems Review of Systems  Constitutional: Positive for activity change and fatigue. Negative for appetite change, chills, diaphoresis and fever.  HENT: Positive for congestion and rhinorrhea.   Respiratory: Positive for cough. Negative for shortness of breath, wheezing and stridor.   Musculoskeletal: Positive for myalgias.  All other systems reviewed and are negative.    Physical Exam Triage Vital Signs ED Triage Vitals  Enc Vitals Group     BP 02/26/20 1553 130/83     Pulse Rate 02/26/20 1553 61      Resp 02/26/20 1553 16     Temp 02/26/20 1553 98.1 F (36.7 C)     Temp Source 02/26/20 1553 Oral     SpO2 02/26/20 1553 96 %     Weight 02/26/20 1552 172 lb (78 kg)     Height 02/26/20 1552 5\' 5"  (1.651 m)     Head Circumference --      Peak Flow --      Pain Score 02/26/20 1552 8     Pain Loc --      Pain Edu? --      Excl. in [place]? --    No data found.  Updated Vital Signs BP 130/83 (BP Location: Left Arm)   Pulse 61   Temp 98.1 F (36.7 C) (Oral)   Resp 16   Ht 5\' 5"  (1.651 m)   Wt 172 lb (78 kg)   SpO2 96%   BMI 28.62 kg/m   Visual Acuity Right Eye Distance:   Left Eye Distance:   Bilateral Distance:    Right Eye Near:   Left Eye Near:    Bilateral Near:     Physical Exam Vitals and nursing note reviewed.  Constitutional:      General: She is not in acute distress.    Appearance: Normal appearance. She is not ill-appearing or toxic-appearing.  HENT:     Head: Normocephalic and atraumatic.     Nose: Rhinorrhea present.  Eyes:     Conjunctiva/sclera: Conjunctivae normal.  Cardiovascular:     Rate and Rhythm: Normal rate and regular rhythm.     Heart sounds: Normal heart sounds.  Pulmonary:     Effort: Pulmonary effort is normal.     Breath sounds: Normal breath sounds.  Musculoskeletal:        General: Normal range of motion.     Cervical back: Normal range of motion and neck supple.  Skin:    General: Skin is warm and dry.  Neurological:     General: No focal deficit present.     Mental Status: She is alert and oriented to person, place, and time.  Psychiatric:        Mood and Affect: Mood normal.        Behavior: Behavior normal.        Thought Content: Thought content normal.        Judgment: Judgment normal.      UC Treatments / Results  Labs (all  labs ordered are listed, but only abnormal results are displayed) Labs Reviewed  SARS CORONAVIRUS 2 (TAT 6-24 HRS)    EKG   Radiology No results found.  Procedures Procedures (including  critical care time)  Medications Ordered in UC Medications - No data to display  Initial Impression / Assessment and Plan / UC Course  I have reviewed the triage vital signs and the nursing notes.  Pertinent labs & imaging results that were available during my care of the patient were reviewed by me and considered in my medical decision making (see chart for details).   69 year old female presents with a day history of runny nose cough and body aches.  She Covid vaccinations in February and March.  She is afebrile O2 sats 96% on room air pulse of 61 and blood pressure 130/83.  She was tested for COVID-19 today.  His exam was reassuring.  Breath sounds were normal.  I have recommended that she use Flonase daily for the next 2 weeks as well as Zyrtec Allegra or Claritin.  Should get rest and drink plenty of fluids.  Remain quarantined until the results of Covid are available in 12 to 24 hours.  She continues to feel her symptoms in several more days I have recommended she follow-up with her primary care physician.   Final Clinical Impressions(s) / UC Diagnoses   Final diagnoses:  Upper respiratory tract infection, unspecified type     Discharge Instructions     Recommend using Flonase 2 sprays each nostril daily for the next 2 weeks.  Use Allegra Claritin or Zyrtec daily.  Get plenty of rest and drink lots of fluids.  If you worsen or not better in several days follow-up with your primary care physician.  Covid testing results will be available 12 to 24 hours.  You will find this in your MyChart.    ED Prescriptions    None     PDMP not reviewed this encounter.   Lorin Picket, PA-C 02/26/20 1629

## 2020-02-26 NOTE — Discharge Instructions (Addendum)
Recommend using Flonase 2 sprays each nostril daily for the next 2 weeks.  Use Allegra Claritin or Zyrtec daily.  Get plenty of rest and drink lots of fluids.  If you worsen or not better in several days follow-up with your primary care physician.  Covid testing results will be available 12 to 24 hours.  You will find this in your MyChart.

## 2020-02-27 LAB — SARS CORONAVIRUS 2 (TAT 6-24 HRS): SARS Coronavirus 2: NEGATIVE

## 2020-02-28 ENCOUNTER — Other Ambulatory Visit: Admission: RE | Admit: 2020-02-28 | Payer: Medicare HMO | Source: Ambulatory Visit

## 2020-02-29 NOTE — Discharge Instructions (Signed)

## 2020-03-01 ENCOUNTER — Ambulatory Visit: Payer: Medicare HMO | Admitting: Anesthesiology

## 2020-03-01 ENCOUNTER — Other Ambulatory Visit: Payer: Self-pay

## 2020-03-01 ENCOUNTER — Ambulatory Visit
Admission: RE | Admit: 2020-03-01 | Discharge: 2020-03-01 | Disposition: A | Discharge status: Home / Self Care | Payer: Medicare HMO | Attending: Ophthalmology | Admitting: Ophthalmology

## 2020-03-01 ENCOUNTER — Encounter
Admission: RE | Disposition: A | Discharge status: Home / Self Care | Payer: Self-pay | Source: Home / Self Care | Attending: Ophthalmology

## 2020-03-01 DIAGNOSIS — M810 Age-related osteoporosis without current pathological fracture: Secondary | ICD-10-CM | POA: Diagnosis not present

## 2020-03-01 DIAGNOSIS — Z79890 Hormone replacement therapy (postmenopausal): Secondary | ICD-10-CM | POA: Diagnosis not present

## 2020-03-01 DIAGNOSIS — Z79899 Other long term (current) drug therapy: Secondary | ICD-10-CM | POA: Insufficient documentation

## 2020-03-01 DIAGNOSIS — H2511 Age-related nuclear cataract, right eye: Secondary | ICD-10-CM | POA: Insufficient documentation

## 2020-03-01 DIAGNOSIS — F329 Major depressive disorder, single episode, unspecified: Secondary | ICD-10-CM | POA: Insufficient documentation

## 2020-03-01 DIAGNOSIS — Z7983 Long term (current) use of bisphosphonates: Secondary | ICD-10-CM | POA: Diagnosis not present

## 2020-03-01 DIAGNOSIS — K219 Gastro-esophageal reflux disease without esophagitis: Secondary | ICD-10-CM | POA: Diagnosis not present

## 2020-03-01 DIAGNOSIS — E039 Hypothyroidism, unspecified: Secondary | ICD-10-CM | POA: Insufficient documentation

## 2020-03-01 HISTORY — DX: Hypothyroidism, unspecified: E03.9

## 2020-03-01 HISTORY — PX: CATARACT EXTRACTION W/PHACO: SHX586

## 2020-03-01 SURGERY — PHACOEMULSIFICATION, CATARACT, WITH IOL INSERTION
Anesthesia: Monitor Anesthesia Care | Site: Eye | Laterality: Right

## 2020-03-01 MED ORDER — ARMC OPHTHALMIC DILATING DROPS
1.0000 "application " | OPHTHALMIC | Status: DC | PRN
  Administered 2020-03-01 (×3): 1 via OPHTHALMIC

## 2020-03-01 MED ORDER — NA HYALUR & NA CHOND-NA HYALUR 0.4-0.35 ML IO KIT
PACK | INTRAOCULAR | Status: DC | PRN
  Administered 2020-03-01: 1 mL via INTRAOCULAR

## 2020-03-01 MED ORDER — LIDOCAINE HCL (PF) 2 % IJ SOLN
INTRAOCULAR | Status: DC | PRN
  Administered 2020-03-01: 10:00:00 1 mL

## 2020-03-01 MED ORDER — BRIMONIDINE TARTRATE-TIMOLOL 0.2-0.5 % OP SOLN
OPHTHALMIC | Status: DC | PRN
  Administered 2020-03-01: 1 [drp] via OPHTHALMIC

## 2020-03-01 MED ORDER — MIDAZOLAM HCL 2 MG/2ML IJ SOLN
INTRAMUSCULAR | Status: DC | PRN
  Administered 2020-03-01 (×2): 1 mg via INTRAVENOUS

## 2020-03-01 MED ORDER — MOXIFLOXACIN HCL 0.5 % OP SOLN
1.0000 [drp] | OPHTHALMIC | Status: DC | PRN
  Administered 2020-03-01 (×3): 1 [drp] via OPHTHALMIC

## 2020-03-01 MED ORDER — FENTANYL CITRATE (PF) 100 MCG/2ML IJ SOLN
INTRAMUSCULAR | Status: DC | PRN
  Administered 2020-03-01: 50 ug via INTRAVENOUS

## 2020-03-01 MED ORDER — CEFUROXIME OPHTHALMIC INJECTION 1 MG/0.1 ML
INJECTION | OPHTHALMIC | Status: DC | PRN
  Administered 2020-03-01: 0.1 mL via INTRACAMERAL

## 2020-03-01 MED ORDER — EPINEPHRINE PF 1 MG/ML IJ SOLN
INTRAOCULAR | Status: DC | PRN
  Administered 2020-03-01: 67 mL via OPHTHALMIC

## 2020-03-01 MED ORDER — TETRACAINE HCL 0.5 % OP SOLN
1.0000 [drp] | OPHTHALMIC | Status: DC | PRN
  Administered 2020-03-01 (×3): 1 [drp] via OPHTHALMIC

## 2020-03-01 MED ORDER — LACTATED RINGERS IV SOLN: 10.0000 mL/h | INTRAVENOUS | Status: DC

## 2020-03-01 SURGICAL SUPPLY — 29 items
CANNULA ANT/CHMB 27G (MISCELLANEOUS) ×1 IMPLANT
CANNULA ANT/CHMB 27GA (MISCELLANEOUS) ×2 IMPLANT
GLOVE SURG LX 7.5 STRW (GLOVE) ×2
GLOVE SURG LX STRL 7.5 STRW (GLOVE) ×1 IMPLANT
GLOVE SURG TRIUMPH 8.0 PF LTX (GLOVE) ×2 IMPLANT
GOWN STRL REUS W/ TWL LRG LVL3 (GOWN DISPOSABLE) ×2 IMPLANT
GOWN STRL REUS W/TWL LRG LVL3 (GOWN DISPOSABLE) ×4
LENS IOL TECNIS 20.0 (Intraocular Lens) ×2 IMPLANT
LENS IOL TECNIS MONO 1P 20.0 (Intraocular Lens) IMPLANT
MARKER SKIN DUAL TIP RULER LAB (MISCELLANEOUS) ×2 IMPLANT
NDL CAPSULORHEX 25GA (NEEDLE) ×1 IMPLANT
NDL FILTER BLUNT 18X1 1/2 (NEEDLE) ×2 IMPLANT
NDL RETROBULBAR .5 NSTRL (NEEDLE) IMPLANT
NEEDLE CAPSULORHEX 25GA (NEEDLE) ×2 IMPLANT
NEEDLE FILTER BLUNT 18X 1/2SAF (NEEDLE) ×2
NEEDLE FILTER BLUNT 18X1 1/2 (NEEDLE) ×2 IMPLANT
PACK CATARACT BRASINGTON (MISCELLANEOUS) ×2 IMPLANT
PACK EYE AFTER SURG (MISCELLANEOUS) ×2 IMPLANT
PACK OPTHALMIC (MISCELLANEOUS) ×2 IMPLANT
RING MALYGIN 7.0 (MISCELLANEOUS) IMPLANT
SOLUTION OPHTHALMIC SALT (MISCELLANEOUS) ×2 IMPLANT
SUT ETHILON 10-0 CS-B-6CS-B-6 (SUTURE)
SUT VICRYL  9 0 (SUTURE)
SUT VICRYL 9 0 (SUTURE) IMPLANT
SUTURE EHLN 10-0 CS-B-6CS-B-6 (SUTURE) IMPLANT
SYR 3ML LL SCALE MARK (SYRINGE) ×4 IMPLANT
SYR TB 1ML LUER SLIP (SYRINGE) ×2 IMPLANT
WATER STERILE IRR 250ML POUR (IV SOLUTION) ×2 IMPLANT
WIPE NON LINTING 3.25X3.25 (MISCELLANEOUS) ×2 IMPLANT

## 2020-03-01 NOTE — H&P (Signed)

## 2020-03-01 NOTE — Anesthesia Preprocedure Evaluation (Signed)
Anesthesia Evaluation  Patient identified by MRN, date of birth, ID band Patient awake    Reviewed: Allergy & Precautions, H&P , NPO status , Patient's Chart, lab work & pertinent test results  Airway Mallampati: II  TM Distance: >3 FB Neck ROM: full    Dental no notable dental hx.    Pulmonary    Pulmonary exam normal breath sounds clear to auscultation       Cardiovascular Normal cardiovascular exam Rhythm:regular Rate:Normal     Neuro/Psych PSYCHIATRIC DISORDERS    GI/Hepatic GERD  ,  Endo/Other  Hypothyroidism   Renal/GU      Musculoskeletal   Abdominal   Peds  Hematology   Anesthesia Other Findings   Reproductive/Obstetrics                             Anesthesia Physical Anesthesia Plan  ASA: II  Anesthesia Plan: MAC   Post-op Pain Management:    Induction:   PONV Risk Score and Plan: 2 and Treatment may vary due to age or medical condition, TIVA and Midazolam  Airway Management Planned:   Additional Equipment:   Intra-op Plan:   Post-operative Plan:   Informed Consent: I have reviewed the patients History and Physical, chart, labs and discussed the procedure including the risks, benefits and alternatives for the proposed anesthesia with the patient or authorized representative who has indicated his/her understanding and acceptance.     Dental Advisory Given  Plan Discussed with: CRNA  Anesthesia Plan Comments:         Anesthesia Quick Evaluation  

## 2020-03-01 NOTE — Anesthesia Procedure Notes (Signed)
Procedure Name: MAC Date/Time: 03/01/2020 9:44 AM Performed by: Vanetta Shawl, CRNA Pre-anesthesia Checklist: Patient identified, Emergency Drugs available, Suction available, Timeout performed and Patient being monitored Patient Re-evaluated:Patient Re-evaluated prior to induction Oxygen Delivery Method: Nasal cannula Placement Confirmation: positive ETCO2

## 2020-03-01 NOTE — Op Note (Signed)
LOCATION:  [place]   PREOPERATIVE DIAGNOSIS:    Nuclear sclerotic cataract right eye. H25.11   POSTOPERATIVE DIAGNOSIS:  Nuclear sclerotic cataract right eye.     PROCEDURE:  Phacoemusification with posterior chamber intraocular lens placement of the right eye   ULTRASOUND TIME: Procedure(s) with comments: CATARACT EXTRACTION PHACO AND INTRAOCULAR LENS PLACEMENT (IOC) RIGHT (Right) - 4.86 0:45.9 10.6%  LENS:   Implant Name Type Inv. Item Serial No. Manufacturer Lot No. LRB No. Used Action  LENS IOL TECNIS 20.0 - [id number] Intraocular Lens LENS IOL TECNIS 20.0 [id number] AMO  Right 1 Implanted         SURGEON:  Wyonia Hough, MD   ANESTHESIA:  Topical with tetracaine drops and 2% Xylocaine jelly, augmented with 1% preservative-free intracameral lidocaine.    COMPLICATIONS:  None.   DESCRIPTION OF PROCEDURE:  The patient was identified in the holding room and transported to the operating room and placed in the supine position under the operating microscope.  The right eye was identified as the operative eye and it was prepped and draped in the usual sterile ophthalmic fashion.   A 1 millimeter clear-corneal paracentesis was made at the 12:00 position.  0.5 ml of preservative-free 1% lidocaine was injected into the anterior chamber. The anterior chamber was filled with Viscoat viscoelastic.  A 2.4 millimeter keratome was used to make a near-clear corneal incision at the 9:00 position.  A curvilinear capsulorrhexis was made with a cystotome and capsulorrhexis forceps.  Balanced salt solution was used to hydrodissect and hydrodelineate the nucleus.   Phacoemulsification was then used in stop and chop fashion to remove the lens nucleus and epinucleus.  The remaining cortex was then removed using the irrigation and aspiration handpiece. Provisc was then placed into the capsular bag to distend it for lens placement.  A lens was then injected into the capsular bag.  The  remaining viscoelastic was aspirated.   Wounds were hydrated with balanced salt solution.  The anterior chamber was inflated to a physiologic pressure with balanced salt solution.  No wound leaks were noted. Cefuroxime 0.1 ml of a 10mg /ml solution was injected into the anterior chamber for a dose of 1 mg of intracameral antibiotic at the completion of the case.   Timolol and Brimonidine drops were applied to the eye.  The patient was taken to the recovery room in stable condition without complications of anesthesia or surgery.   Sharvi Mooneyhan 03/01/2020, 10:08 AM

## 2020-03-01 NOTE — Transfer of Care (Signed)
Immediate Anesthesia Transfer of Care Note  Patient: Heather Pacheco  Procedure(s) Performed: CATARACT EXTRACTION PHACO AND INTRAOCULAR LENS PLACEMENT (IOC) RIGHT (Right Eye)  Patient Location: PACU  Anesthesia Type: MAC  Level of Consciousness: awake, alert  and patient cooperative  Airway and Oxygen Therapy: Patient Spontanous Breathing   Post-op Assessment: Post-op Vital signs reviewed, Patient's Cardiovascular Status Stable, Respiratory Function Stable, Patent Airway and No signs of Nausea or vomiting  Post-op Vital Signs: Reviewed and stable  Complications: No apparent anesthesia complications

## 2020-03-01 NOTE — Anesthesia Postprocedure Evaluation (Signed)
Anesthesia Post Note  Patient: TAUHEEDAH BOK  Procedure(s) Performed: CATARACT EXTRACTION PHACO AND INTRAOCULAR LENS PLACEMENT (IOC) RIGHT (Right Eye)     Patient location during evaluation: PACU Anesthesia Type: MAC Level of consciousness: awake and alert and oriented Pain management: satisfactory to patient Vital Signs Assessment: post-procedure vital signs reviewed and stable Respiratory status: spontaneous breathing, nonlabored ventilation and respiratory function stable Cardiovascular status: blood pressure returned to baseline and stable Postop Assessment: Adequate PO intake and No signs of nausea or vomiting Anesthetic complications: no    Raliegh Ip

## 2020-03-02 ENCOUNTER — Encounter: Payer: Self-pay | Admitting: *Deleted

## 2020-03-28 ENCOUNTER — Other Ambulatory Visit: Payer: Self-pay | Admitting: Internal Medicine

## 2020-03-28 DIAGNOSIS — Z1231 Encounter for screening mammogram for malignant neoplasm of breast: Secondary | ICD-10-CM

## 2020-04-05 ENCOUNTER — Ambulatory Visit
Admission: RE | Admit: 2020-04-05 | Discharge: 2020-04-05 | Disposition: A | Discharge status: Home / Self Care | Payer: Medicare HMO | Source: Ambulatory Visit | Attending: Internal Medicine | Admitting: Internal Medicine

## 2020-04-05 DIAGNOSIS — Z1231 Encounter for screening mammogram for malignant neoplasm of breast: Secondary | ICD-10-CM | POA: Diagnosis present

## 2020-05-15 ENCOUNTER — Encounter: Payer: Self-pay | Admitting: Certified Nurse Midwife

## 2020-05-15 ENCOUNTER — Other Ambulatory Visit: Payer: Self-pay

## 2020-05-15 ENCOUNTER — Ambulatory Visit (INDEPENDENT_AMBULATORY_CARE_PROVIDER_SITE_OTHER): Payer: Medicare HMO | Admitting: Certified Nurse Midwife

## 2020-05-15 VITALS — BP 120/60 | HR 84 | Ht 65.0 in | Wt 175.0 lb

## 2020-05-15 DIAGNOSIS — Z01419 Encounter for gynecological examination (general) (routine) without abnormal findings: Secondary | ICD-10-CM | POA: Diagnosis not present

## 2020-05-15 DIAGNOSIS — N3943 Post-void dribbling: Secondary | ICD-10-CM | POA: Diagnosis not present

## 2020-05-15 DIAGNOSIS — Z124 Encounter for screening for malignant neoplasm of cervix: Secondary | ICD-10-CM

## 2020-05-15 LAB — POCT URINALYSIS DIPSTICK (MANUAL)
Nitrite, UA: NEGATIVE
Poct Bilirubin: NEGATIVE
Poct Blood: 50 — AB
Poct Glucose: NORMAL mg/dL
Poct Ketones: NEGATIVE
Poct Protein: NEGATIVE mg/dL
Poct Urobilinogen: NORMAL mg/dL
Spec Grav, UA: <=1.005 — AB (ref 1.010–1.025)
pH, UA: 5 (ref 5.0–8.0)

## 2020-05-15 NOTE — Progress Notes (Signed)
Gynecology Annual Exam  PCP: Heather Ruths, MD  Chief Complaint:  Chief Complaint  Patient presents with  . Gynecologic Exam    shoulder blade/arm hurting - seeing chiropractor    History of Present Illness:Heather Pacheco is a 69 year old Caucasian/White female, G4 P2022, who presents for her annual exam. She is having no significant GYN problems. Does report having some dribbling at the end of her urinary stream, when she thinks she is done voiding. No dysuria. Her menses are absent and she is postmenopausal. She does not have hot flashes.  She has had no spotting.   The patient's past medical history is notable for a history of depression, hypothyroidism, hyperlipidemia, osteoporosis of the spine, and GERD.  Since her last annual GYN exam dated 01/02/2018, she has had cataracts removed from both eyes. She received the Pfizer vaccine this year. She had retired from [employer] in 2019 then went back to work during the pandemic, and has now retired again. Currently is having some problems with pain in her left shoulder that radiates to her upper arm x 3-4 weeks. Has seen a chiropractor for this and has taken meloxicam with little relief.  She is not sexually active. She has been sexually active in the past but not currently.  Her most recent pap smear was obtained 01/02/2018 and was NIL Her most recent mammogram obtained on 04/05/2020 was normal and revealed no significant changes.  There is a family history of breast cancer in her sister. Genetic testing has not been done There is no family history of ovarian cancer.  The patient does do monthly self breast exams.  She had a colonoscopy November 2018 ago that was normal. Her next colonoscopy is due in 5 years.   She had a recent DEXA scan 12/30/2016 revealing osteoporosis of the spine with a T score of -2.7. Her PCP Dr Ouida Sills, her PCP, placed her on Boniva. Her insurance stopped covering [place] and she was switched to Fosamax  weekly The patient does not smoke.  The patient does not drink alcohol.  The patient does not use illegal drugs.  The patient exercises 2-3x/week at the [facility]. The patient does get adequate calcium in her diet.  She had a recent cholesterol screen 12/30/2019                                 that was abnormal. She had an elevated TC and LDL.    Review of Systems: Review of Systems  Constitutional: Negative for chills, fever and weight loss.  HENT: Negative for congestion, sinus pain and sore throat.        Positive for sneezing  Eyes: Negative for blurred vision and pain.  Respiratory: Negative for hemoptysis, shortness of breath and wheezing.   Cardiovascular: Negative for chest pain, palpitations and leg swelling.  Gastrointestinal: Negative for abdominal pain, blood in stool, diarrhea, heartburn, nausea and vomiting.  Genitourinary: Negative for dysuria, frequency, hematuria and urgency.  Musculoskeletal: Positive for joint pain (left shoulder and arm pain). Negative for back pain and myalgias.  Skin: Negative for itching and rash.  Neurological: Negative for dizziness, tingling and headaches.  Endo/Heme/Allergies: Positive for environmental allergies. Negative for polydipsia. Does not bruise/bleed easily.       Negative for hirsutism   Psychiatric/Behavioral: Positive for depression. The patient is not nervous/anxious and does not have insomnia.     Past Medical History:  Past Medical History:  Diagnosis Date  . Cataract   . Depression   . GERD (gastroesophageal reflux disease)   . Hyperlipidemia   . Hypothyroidism   . 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 Thyroid disease     Past Surgical History:  Past Surgical History:  Procedure Laterality Date  . BREAST BIOPSY Left 1998   Stereotactic biopsy benign  . CATARACT EXTRACTION W/PHACO Left 07/21/2019   Procedure: CATARACT EXTRACTION PHACO AND INTRAOCULAR LENS PLACEMENT (IOC) LEFT  00:50.7  11.0%  5.60;   Surgeon: Leandrew Koyanagi, MD;  Location: [place];  Service: Ophthalmology;  Laterality: Left;  LEAVE PATIENT 3RD BECAUSE OF TRANSPORTATION  . CATARACT EXTRACTION W/PHACO Right 03/01/2020   Procedure: CATARACT EXTRACTION PHACO AND INTRAOCULAR LENS PLACEMENT ([place]) RIGHT;  Surgeon: Leandrew Koyanagi, MD;  Location: [place] [facility];  Service: Ophthalmology;  Laterality: Right;  4.86 0:45.9 10.6%  . COLONOSCOPY  2008; 08/2017   normal colon   . HERNIA REPAIR     umbilical  . TUBAL LIGATION      Family History:  Family History  Problem Relation Age of Onset  . Breast cancer Sister 89  . Colon cancer Mother 29       rectal cancer  . Hypertension Mother   . Diabetes Maternal Aunt 40    Social History:  Social History   Socioeconomic History  . Marital status: Divorced    Spouse name: Not on file  . Number of children: 2  . Years of education: Not on file  . Highest education level: Not on file  Occupational History  . Occupation: Retired from [employer] 2019  Tobacco Use  . Smoking status: Never Smoker  . Smokeless tobacco: Never Used  Vaping Use  . Vaping Use: Never used  Substance and Sexual Activity  . Alcohol use: No    Comment: Rare   . Drug use: No  . Sexual activity: Not Currently    Birth control/protection: Post-menopausal  Other Topics Concern  . Not on file  Social History Narrative  . Not on file   Social Determinants of Health   Financial Resource Strain:   . Difficulty of Paying Living Expenses:   Food Insecurity:   . Worried About Charity fundraiser in the Last Year:   . Arboriculturist in the Last Year:   Transportation Needs:   . Film/video editor (Medical):   Marland Kitchen Lack of Transportation (Non-Medical):   Physical Activity:   . Days of Exercise per Week:   . Minutes of Exercise per Session:   Stress:   . Feeling of Stress :   Social Connections:   . Frequency of Communication with Friends and Family:   . Frequency of  Social Gatherings with Friends and Family:   . Attends Religious Services:   . Active Member of Clubs or Organizations:   . Attends Archivist Meetings:   Marland Kitchen Marital Status:   Intimate Partner Violence:   . Fear of Current or Ex-Partner:   . Emotionally Abused:   Marland Kitchen Physically Abused:   . Sexually Abused:     Allergies:  No Known Allergies  Medications:  Current Outpatient Medications on File Prior to Visit  Medication Sig Dispense Refill  . Cholecalciferol (VITAMIN D) 2000 units tablet Take by mouth.    . citalopram (CELEXA) 20 MG tablet Take 30 mg by mouth daily.     . fluticasone (FLONASE) 50 MCG/ACT nasal spray     .  levothyroxine (SYNTHROID, LEVOTHROID) 88 MCG tablet     . meloxicam (MOBIC) 15 MG tablet Take 15 mg by mouth daily.    . Multiple Vitamin (MULTIVITAMIN) tablet Take 1 tablet by mouth daily.    Marland Kitchen omeprazole (PRILOSEC) 20 MG capsule TAKE 1 CAPSULE BY MOUTH  ONCE A DAY    . azelastine (OPTIVAR) 0.05 % ophthalmic solution 1 drop 2 (two) times daily. (Patient not taking: Reported on 05/15/2020)    . chlorhexidine (PERIDEX) 0.12 % solution  (Patient not taking: Reported on 05/15/2020)    . PREVNAR 13 SUSP injection ADM 0.5ML IM UTD (Patient not taking: Reported on 05/15/2020)  0   No current facility-administered medications on file prior to visit.   Physical Exam Vitals: BP 120/60   Pulse 84   Ht 5\' 5"  (1.651 m)   Wt 175 lb (79.4 kg)   BMI 29.12 kg/m   General: WF in NAD HEENT: normocephalic, anicteric Neck: no thyroid enlargement, no palpable nodules, no cervical lymphadenopathy  Pulmonary: No increased work of breathing, CTAB Cardiovascular: RRR, without murmur  Breast: Breast symmetrical, no tenderness, no palpable nodules or masses, no skin or nipple retraction present, no nipple discharge.  No axillary, infraclavicular or supraclavicular lymphadenopathy. Abdomen: Soft, non-tender, non-distended.  Umbilicus without lesions.  No hepatomegaly or masses  palpable. No evidence of hernia. Genitourinary:  External: Normal external female genitalia.  Normal urethral meatus, normal Bartholin's and Skene's glands.    Vagina: Normal vaginal mucosa, no evidence of prolapse.    Cervix: Grossly normal in appearance, no bleeding, non-tender  Uterus: Retroverted, normal size, shape, and consistency, mobile, and non-tender  Adnexa: No adnexal masses, non-tender  Rectal: deferred  Lymphatic: no evidence of inguinal lymphadenopathy Extremities: no edema, erythema, or tenderness Neurologic: Grossly intact Psychiatric: mood appropriate, affect full  Urine dipstick: sp gravity 1.005, clear light yellow, small leukocytes, trace blood and otherwise negative.   Assessment: 69 y.o. annual gyn exam Dribbling of urinary stream-R/O UTI  Urine culture sent  Plan:   1) Breast cancer screening - recommend monthly self breast exam and annual screening mammogram. Mammogram is UTD  2) Colon cancer screening: colonoscopy 08/2017 was normal. Next due in 2023  3) Cervical cancer screening - Pap was done. ASCCP guidelines and rational discussed.  Patient opts for every 2 years screening interval   4) Osteoporosis of spine: follow up DEXA per her PCP  5) Routine healthcare maintenance including cholesterol and diabetes screening managed by PCP. Recommend seeing [facility] for her shoulder and arm pain.   Dalia Heading, CNM

## 2020-05-16 LAB — IGP,RFX APTIMA HPV ALL PTH

## 2020-05-17 LAB — URINE CULTURE: Organism ID, Bacteria: NO GROWTH

## 2020-07-18 ENCOUNTER — Telehealth: Payer: Self-pay

## 2020-07-18 NOTE — Telephone Encounter (Signed)
Pt left message on triage line reporting uti symptoms, I called her back advised her to drop off a urine sample and we will check it and send a urine culture to lab. She states she will call me back she has also called her PCP

## 2020-07-20 ENCOUNTER — Emergency Department
Admission: EM | Admit: 2020-07-20 | Discharge: 2020-07-20 | Disposition: A | Discharge status: Home / Self Care | Payer: Medicare HMO | Attending: Emergency Medicine | Admitting: Emergency Medicine

## 2020-07-20 ENCOUNTER — Other Ambulatory Visit: Payer: Self-pay

## 2020-07-20 ENCOUNTER — Emergency Department: Payer: Medicare HMO

## 2020-07-20 ENCOUNTER — Encounter: Payer: Self-pay | Admitting: Emergency Medicine

## 2020-07-20 DIAGNOSIS — Z79890 Hormone replacement therapy (postmenopausal): Secondary | ICD-10-CM | POA: Diagnosis not present

## 2020-07-20 DIAGNOSIS — E039 Hypothyroidism, unspecified: Secondary | ICD-10-CM | POA: Diagnosis not present

## 2020-07-20 DIAGNOSIS — Z20822 Contact with and (suspected) exposure to covid-19: Secondary | ICD-10-CM | POA: Diagnosis not present

## 2020-07-20 DIAGNOSIS — I700 Atherosclerosis of aorta: Secondary | ICD-10-CM | POA: Insufficient documentation

## 2020-07-20 DIAGNOSIS — R0602 Shortness of breath: Secondary | ICD-10-CM | POA: Diagnosis not present

## 2020-07-20 DIAGNOSIS — R071 Chest pain on breathing: Secondary | ICD-10-CM | POA: Insufficient documentation

## 2020-07-20 DIAGNOSIS — Z79899 Other long term (current) drug therapy: Secondary | ICD-10-CM | POA: Insufficient documentation

## 2020-07-20 DIAGNOSIS — B349 Viral infection, unspecified: Secondary | ICD-10-CM | POA: Insufficient documentation

## 2020-07-20 DIAGNOSIS — R5381 Other malaise: Secondary | ICD-10-CM | POA: Diagnosis not present

## 2020-07-20 DIAGNOSIS — M791 Myalgia, unspecified site: Secondary | ICD-10-CM | POA: Insufficient documentation

## 2020-07-20 DIAGNOSIS — R5383 Other fatigue: Secondary | ICD-10-CM | POA: Diagnosis present

## 2020-07-20 LAB — BASIC METABOLIC PANEL
Anion gap: 12 (ref 5–15)
BUN: 16 mg/dL (ref 8–23)
CO2: 21 mmol/L — ABNORMAL LOW (ref 22–32)
Calcium: 9.6 mg/dL (ref 8.9–10.3)
Chloride: 102 mmol/L (ref 98–111)
Creatinine, Ser: 0.78 mg/dL (ref 0.44–1.00)
GFR calc Af Amer: >60 mL/min (ref >60)
GFR calc non Af Amer: >60 mL/min (ref >60)
Glucose, Bld: 104 mg/dL — ABNORMAL HIGH (ref 70–99)
Potassium: 4.1 mmol/L (ref 3.5–5.1)
Sodium: 135 mmol/L (ref 135–145)

## 2020-07-20 LAB — CBC
HCT: 47 % — ABNORMAL HIGH (ref 36.0–46.0)
Hemoglobin: 15.4 g/dL — ABNORMAL HIGH (ref 12.0–15.0)
MCH: 29.7 pg (ref 26.0–34.0)
MCHC: 32.8 g/dL (ref 30.0–36.0)
MCV: 90.7 fL (ref 80.0–100.0)
Platelets: 242 10*3/uL (ref 150–400)
RBC: 5.18 MIL/uL — ABNORMAL HIGH (ref 3.87–5.11)
RDW: 14 % (ref 11.5–15.5)
WBC: 14 10*3/uL — ABNORMAL HIGH (ref 4.0–10.5)
nRBC: 0 % (ref 0.0–0.2)

## 2020-07-20 LAB — RESPIRATORY PANEL BY RT PCR (FLU A&B, COVID)
Influenza A by PCR: NEGATIVE
Influenza B by PCR: NEGATIVE
SARS Coronavirus 2 by RT PCR: NEGATIVE

## 2020-07-20 LAB — TROPONIN I (HIGH SENSITIVITY)
Troponin I (High Sensitivity): 3 ng/L (ref <18)
Troponin I (High Sensitivity): 4 ng/L (ref <18)

## 2020-07-20 MED ORDER — ACETAMINOPHEN 325 MG PO TABS
650.0000 mg | ORAL_TABLET | Freq: Once | ORAL | Status: AC
  Administered 2020-07-20: 650 mg via ORAL
  Filled 2020-07-20: qty 2

## 2020-07-20 MED ORDER — PREDNISONE 50 MG PO TABS
50.0000 mg | ORAL_TABLET | Freq: Every day | ORAL | 0 refills | Status: AC
Start: 2020-07-20 — End: ?

## 2020-07-20 MED ORDER — IOHEXOL 350 MG/ML SOLN
75.0000 mL | Freq: Once | INTRAVENOUS | Status: AC | PRN
  Administered 2020-07-20: 75 mL via INTRAVENOUS
  Filled 2020-07-20: qty 75

## 2020-07-20 MED ORDER — PSEUDOEPH-BROMPHEN-DM 30-2-10 MG/5ML PO SYRP: 10.0000 mL | ORAL_SOLUTION | Freq: Four times a day (QID) | ORAL | 0 refills | Status: AC | PRN

## 2020-07-20 MED ORDER — BENZONATATE 100 MG PO CAPS: 100.0000 mg | ORAL_CAPSULE | Freq: Four times a day (QID) | ORAL | 0 refills | Status: AC | PRN

## 2020-07-20 NOTE — ED Notes (Signed)
See triage note  Presents with upper chest pain   States pain increases with inspiration  Low grade temp on arrival

## 2020-07-20 NOTE — ED Triage Notes (Signed)
Says she started having upper mid chest pain this am after getting up.  Says it hurts to take a deep breath and it makes her cough.  She says she just started med for uti. She is alert and nin nad.

## 2020-07-20 NOTE — ED Provider Notes (Signed)
[facility] Emergency Department Provider Note  ____________________________________________  Time seen: Approximately 6:37 PM  I have reviewed the triage vital signs and the nursing notes.   HISTORY  Chief Complaint Fever, Chest Pain, and Shortness of Breath    HPI Heather Pacheco is a 69 y.o. female who presents the emergency department for multiple complaints.  Patient reports fatigue, malaise, generalized body aches, headache, pleuritic chest pain.  Patient states that initial symptoms began with pleuritic type chest pain.  She developed more symptoms she was concerned that she may have Covid and she presented to emergency department for evaluation.  No fevers or chills, no nasal congestion or sore throat.  Patient has developed a cough for the last 2 days.  Patient has some mild exertional shortness of breath.  None at rest.  No abdominal pain, nausea vomiting, diarrhea or constipation.  Patient is currently being treated for a urinary tract infection.  She states that her UTI symptoms have been improving.  No flank pain, hematuria.         Past Medical History:  Diagnosis Date  . Cataract   . Depression   . GERD (gastroesophageal reflux disease)   . Hyperlipidemia   . Hypothyroidism   . 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 Thyroid disease     Patient Active Problem List   Diagnosis Date Noted  . Thyroid disease   . Hyperlipidemia   . GERD (gastroesophageal reflux disease)   . Depression   . Osteoporosis 12/30/2016    Past Surgical History:  Procedure Laterality Date  . BREAST BIOPSY Left 1998   Stereotactic biopsy benign  . CATARACT EXTRACTION W/PHACO Left 07/21/2019   Procedure: CATARACT EXTRACTION PHACO AND INTRAOCULAR LENS PLACEMENT (IOC) LEFT  00:50.7  11.0%  5.60;  Surgeon: Leandrew Koyanagi, MD;  Location: [facility];  Service: Ophthalmology;  Laterality: Left;  LEAVE PATIENT 3RD BECAUSE OF  TRANSPORTATION  . CATARACT EXTRACTION W/PHACO Right 03/01/2020   Procedure: CATARACT EXTRACTION PHACO AND INTRAOCULAR LENS PLACEMENT ([place]) RIGHT;  Surgeon: Leandrew Koyanagi, MD;  Location: [place];  Service: Ophthalmology;  Laterality: Right;  4.86 0:45.9 10.6%  . COLONOSCOPY  2008; 08/2017   normal colon   . HERNIA REPAIR     umbilical  . TUBAL LIGATION      Prior to Admission medications   Medication Sig Start Date End Date Taking? Authorizing Provider  benzonatate (TESSALON PERLES) 100 MG capsule Take 1 capsule (100 mg total) by mouth every 6 (six) hours as needed. 07/20/20 07/20/21  Abbigayle Toole, Charline Bills, PA-C  brompheniramine-pseudoephedrine-DM 30-2-10 MG/5ML syrup Take 10 mLs by mouth 4 (four) times daily as needed. 07/20/20   Shalon Salado, Charline Bills, PA-C  Cholecalciferol (VITAMIN D) 2000 units tablet Take by mouth.    [provider]  citalopram (CELEXA) 20 MG tablet Take 30 mg by mouth daily.  12/16/17   [provider]  fluticasone Asencion Islam) 50 MCG/ACT nasal spray  12/16/17   [provider]  levothyroxine (SYNTHROID, LEVOTHROID) 88 MCG tablet  12/16/17   [provider]  meloxicam (MOBIC) 15 MG tablet Take 15 mg by mouth daily. 02/08/20   [provider]  Multiple Vitamin (MULTIVITAMIN) tablet Take 1 tablet by mouth daily.    [provider]  omeprazole (PRILOSEC) 20 MG capsule TAKE 1 CAPSULE BY MOUTH  ONCE A DAY 02/19/17   [provider]  predniSONE (DELTASONE) 50 MG tablet Take 1 tablet (50 mg total)  by mouth daily with breakfast. 07/20/20   Audreanna Torrisi, Charline Bills, PA-C    Allergies Patient has no known allergies.  Family History  Problem Relation Age of Onset  . Breast cancer Sister 2  . Colon cancer Mother 87       rectal cancer  . Hypertension Mother   . Diabetes Maternal Aunt 64    Social History Social History   Tobacco Use  . Smoking status: Never Smoker  . Smokeless tobacco: Never Used   Vaping Use  . Vaping Use: Never used  Substance Use Topics  . Alcohol use: No    Comment: Rare   . Drug use: No     Review of Systems  Constitutional: No fever/chills Eyes: No visual changes. No discharge ENT: No upper respiratory complaints. Cardiovascular: Pleuritic chest pain. Respiratory: Positive cough.  Exertional SOB. Gastrointestinal: No abdominal pain.  No nausea, no vomiting.  No diarrhea.  No constipation. Genitourinary: Negative for dysuria. No hematuria.  Currently receiving antibiotic therapy for UTI Musculoskeletal: Negative for musculoskeletal pain. Skin: Negative for rash, abrasions, lacerations, ecchymosis. Neurological: Negative for headaches, focal weakness or numbness. 10-point ROS otherwise negative.  ____________________________________________   PHYSICAL EXAM:  VITAL SIGNS: ED Triage Vitals  Enc Vitals Group     BP 07/20/20 1451 133/69     Pulse Rate 07/20/20 1451 93     Resp 07/20/20 1701 (!) 24     Temp 07/20/20 1451 99.6 F (37.6 C)     Temp Source 07/20/20 1451 Oral     SpO2 07/20/20 1451 97 %     Weight 07/20/20 1447 172 lb (78 kg)     Height 07/20/20 1447 5\' 5"  (1.651 m)     Head Circumference --      Peak Flow --      Pain Score 07/20/20 1447 0     Pain Loc --      Pain Edu? --      Excl. in [place]? --      Constitutional: Alert and oriented. Well appearing and in no acute distress. Eyes: Conjunctivae are normal. PERRL. EOMI. Head: Atraumatic. ENT:      Ears:       Nose: No congestion/rhinnorhea.      Mouth/Throat: Mucous membranes are moist.  Oropharynx is nonerythematous and nonedematous.  Uvula midline. Neck: No stridor.   Hematological/Lymphatic/Immunilogical: No cervical lymphadenopathy. Cardiovascular: Normal rate, regular rhythm. Normal S1 and S2.  Good peripheral circulation. Respiratory: Normal respiratory effort without tachypnea or retractions. Lungs CTAB. Good air entry to the bases with no decreased or absent breath  sounds. Gastrointestinal: Bowel sounds 4 quadrants. Soft and nontender to palpation. No guarding or rigidity. No palpable masses. No distention. No CVA tenderness. Musculoskeletal: Full range of motion to all extremities. No gross deformities appreciated. Neurologic:  Normal speech and language. No gross focal neurologic deficits are appreciated.  Skin:  Skin is warm, dry and intact. No rash noted. Psychiatric: Mood and affect are normal. Speech and behavior are normal. Patient exhibits appropriate insight and judgement.   ____________________________________________   LABS (all labs ordered are listed, but only abnormal results are displayed)  Labs Reviewed  BASIC METABOLIC PANEL - Abnormal; Notable for the following components:      Result Value   CO2 21 (*)    Glucose, Bld 104 (*)    All other components within normal limits  CBC - Abnormal; Notable for the following components:   WBC 14.0 (*)    RBC 5.18 (*)  Hemoglobin 15.4 (*)    HCT 47.0 (*)    All other components within normal limits  RESPIRATORY PANEL BY RT PCR (FLU A&B, COVID)  TROPONIN I (HIGH SENSITIVITY)  TROPONIN I (HIGH SENSITIVITY)   ____________________________________________  EKG   ____________________________________________  RADIOLOGY I personally viewed and evaluated these images as part of my medical decision making, as well as reviewing the written report by the radiologist.  DG Chest 2 View  Result Date: 07/20/2020 CLINICAL DATA:  Chest pain EXAM: CHEST - 2 VIEW COMPARISON:  12/01/2014 FINDINGS: There are slightly prominent interstitial lung markings bilaterally. The lungs appear hyperexpanded. There are opacities at the costophrenic angles bilaterally. The heart size is unremarkable. There is fluid within the minor fissure on the right. There are atherosclerotic changes of the thoracic aorta. IMPRESSION: Prominent interstitial lung markings which can be seen in patients with developing  interstitial edema or an atypical infectious process. Probable bibasilar atelectasis. Electronically Signed   By: Constance Holster M.D.   On: 07/20/2020 15:19   CT Angio Chest PE W and/or Wo Contrast  Result Date: 07/20/2020 CLINICAL DATA:  Mid and upper chest pain, shortness of breath, tachycardia, cough EXAM: CT ANGIOGRAPHY CHEST WITH CONTRAST TECHNIQUE: Multidetector CT imaging of the chest was performed using the standard protocol during bolus administration of intravenous contrast. Multiplanar CT image reconstructions and MIPs were obtained to evaluate the vascular anatomy. CONTRAST:  69mL OMNIPAQUE IOHEXOL 350 MG/ML SOLN COMPARISON:  07/20/2020 FINDINGS: Cardiovascular: This is a technically adequate evaluation of the pulmonary vasculature. No filling defects or pulmonary emboli. The heart is unremarkable without pericardial effusion. Normal appearance of the thoracic aorta with no aneurysm or dissection. Mild atherosclerosis. Mediastinum/Nodes: Fluid-filled distal thoracic esophagus of uncertain significance. Small hiatal hernia. The thyroid and trachea are unremarkable. No pathologic adenopathy. Lungs/Pleura: Hypoventilatory changes and scarring seen at the lung bases. No airspace disease, effusion, or pneumothorax. Central airways are patent. Upper Abdomen: Indeterminate 15 mm left adrenal nodule, statistically likely an adenoma. No acute upper abdominal process. Musculoskeletal: No acute or destructive bony lesions. Reconstructed images demonstrate no additional findings. Review of the MIP images confirms the above findings. IMPRESSION: 1. No evidence of pulmonary embolus. 2. Small hiatal hernia, with fluid-filled distal thoracic esophagus likely related to reflux. 3. Indeterminate 15 mm left adrenal nodule, statistically likely an adenoma. 4. Aortic Atherosclerosis (ICD10-I70.0). Electronically Signed   By: Randa Ngo M.D.   On: 07/20/2020 20:44     ____________________________________________    PROCEDURES  Procedure(s) performed:    Procedures    Medications  acetaminophen (TYLENOL) tablet 650 mg (650 mg Oral Given 07/20/20 2023)  iohexol (OMNIPAQUE) 350 MG/ML injection 75 mL (75 mLs Intravenous Contrast Given 07/20/20 2031)       Pulmonary Embolism Rule-out Criteria (PERC rule)                        If YES to ANY of the following, the PERC rule is not satisfied and cannot be used to rule out PE in this patient (consider d-dimer or imaging depending on pre-test probability).                      If NO to ALL of the following, AND the clinician's pre-test probability is <15%, the [facility] rule is satisfied and there is no need for further workup (including no need to obtain a d-dimer) as the post-test probability of pulmonary embolism is <2%.  Mnemonic is HAD CLOTS   H - hormone use (exogenous estrogen)      No. A - age > 54                                                 Yes.   D - DVT/PE history                                      No.   C - coughing blood (hemoptysis)                 No. L - leg swelling, unilateral                             No. O - O2 Sat on Room Air < 95%                  Yes.   T - tachycardia (HR ? 100)                         Yes.   S - surgery or trauma, recent                      No.   Based on my evaluation of the patient, including application of this decision instrument, further testing to evaluate for pulmonary embolism is indicated at this time. I have discussed this recommendation with the patient who states understanding and agreement with this plan.   ____________________________________________   INITIAL IMPRESSION / ASSESSMENT AND PLAN / ED COURSE  Pertinent labs & imaging results that were available during my care of the patient were reviewed by me and considered in my medical decision making (see chart for details).  Review of the [place] CSRS was  performed in accordance of the [place] prior to dispensing any controlled drugs.           Patient's diagnosis is consistent with COVID-19.  Patient presented to emergency department complaining of symptoms consistent with COVID-19.  Symptoms began this morning and patient immediately presented to the emergency department.  Patient had a note bit of Covid test, however I feel that given the time between onset of symptoms and test was likely too short to return positive.  Patient had overall reassuring labs.  Given the pleuritic nature of her chest pain with tachycardia, mild hypoxia, I did image the patient with CT scan to ensure no PE.  No evidence of PE.  Incidental finding of left adrenal nodule, likely an adenoma for patient to follow-up with primary care for.  Otherwise I will treat the patient for COVID-19.  If any symptoms change, worsen return to the emergency department for further evaluation..  Patient is given ED precautions to return to the ED for any worsening or new symptoms.     ____________________________________________  FINAL CLINICAL IMPRESSION(S) / ED DIAGNOSES  Final diagnoses:  Viral illness      NEW MEDICATIONS STARTED DURING THIS VISIT:  ED Discharge Orders         Ordered    predniSONE (DELTASONE) 50 MG tablet  Daily with breakfast        07/20/20 2114  brompheniramine-pseudoephedrine-DM 30-2-10 MG/5ML syrup  4 times daily PRN        07/20/20 2114    benzonatate (TESSALON PERLES) 100 MG capsule  Every 6 hours PRN        07/20/20 2114              This chart was dictated using voice recognition software/Dragon. Despite best efforts to proofread, errors can occur which can change the meaning. Any change was purely unintentional.    Brynda Peon 07/20/20 2115    Nance Pear, MD 07/20/20 2245

## 2020-07-26 ENCOUNTER — Other Ambulatory Visit: Payer: Medicare HMO

## 2020-07-26 DIAGNOSIS — Z20822 Contact with and (suspected) exposure to covid-19: Secondary | ICD-10-CM

## 2020-07-27 LAB — NOVEL CORONAVIRUS, NAA: SARS-CoV-2, NAA: NOT DETECTED

## 2020-07-27 LAB — SARS-COV-2, NAA 2 DAY TAT

## 2021-05-01 ENCOUNTER — Other Ambulatory Visit: Payer: Self-pay

## 2021-05-01 ENCOUNTER — Ambulatory Visit (INDEPENDENT_AMBULATORY_CARE_PROVIDER_SITE_OTHER): Payer: Medicare HMO | Admitting: Dermatology

## 2021-05-01 DIAGNOSIS — L821 Other seborrheic keratosis: Secondary | ICD-10-CM

## 2021-05-01 DIAGNOSIS — D2272 Melanocytic nevi of left lower limb, including hip: Secondary | ICD-10-CM | POA: Diagnosis not present

## 2021-05-01 DIAGNOSIS — Z1283 Encounter for screening for malignant neoplasm of skin: Secondary | ICD-10-CM

## 2021-05-01 DIAGNOSIS — L80 Vitiligo: Secondary | ICD-10-CM | POA: Diagnosis not present

## 2021-05-01 DIAGNOSIS — L814 Other melanin hyperpigmentation: Secondary | ICD-10-CM

## 2021-05-01 DIAGNOSIS — D1800 Hemangioma unspecified site: Secondary | ICD-10-CM

## 2021-05-01 DIAGNOSIS — L578 Other skin changes due to chronic exposure to nonionizing radiation: Secondary | ICD-10-CM

## 2021-05-01 DIAGNOSIS — D229 Melanocytic nevi, unspecified: Secondary | ICD-10-CM

## 2021-05-01 MED ORDER — TACROLIMUS 0.1 % EX OINT: TOPICAL_OINTMENT | Freq: Two times a day (BID) | CUTANEOUS | 0 refills | Status: AC

## 2021-05-01 NOTE — Progress Notes (Signed)
New Patient Visit  Subjective  Heather Pacheco is a 70 y.o. female who presents for the following: Annual Exam (No history of skin cancer and nothing new or changing - TBSE today).    The following portions of the chart were reviewed this encounter and updated as appropriate:        Review of Systems:  No other skin or systemic complaints except as noted in HPI or Assessment and Plan.  Objective  Well appearing patient in no apparent distress; mood and affect are within normal limits.  A full examination was performed including scalp, head, eyes, ears, nose, lips, neck, chest, axillae, abdomen, back, buttocks, bilateral upper extremities, bilateral lower extremities, hands, feet, fingers, toes, fingernails, and toenails. All findings within normal limits unless otherwise noted below.  Face, right lower neck, right arm, left axilla Hypopigmented patches of bilateral oral commissures, bilateral med canthus, L lower eyelid, right lower neck, right elbow, left lower axilla and R upper chest.  Left lat thigh 2 mm medium dark brown macule   Assessment & Plan    Vitiligo Face, right lower neck, right arm, left axilla  Reviewed chronic nature, no cure and can be difficult to treat.  Vitiligo is an autoimmune condition which causes loss of skin pigment and is commonly seen on the face and may also involve areas of trauma like hands, elbows, knees, and ankles.  Treatments include topical steroids and other topical anti-inflammatory ointments/creams.  Sometimes narrow band UV light therapy or Xtrac laser is helpful, both of which require twice weekly treatments for at least 3-6 months.  Antioxidant vitamins, such as Vitamins C&E, and alpha lipoic acid may be added to enhance treatment.  Start Tacrolimus 0.1% ointment bid   tacrolimus (PROTOPIC) 0.1 % ointment - Face, right lower neck, right arm, left axilla Apply topically 2 (two) times daily.  Nevus Left lat  thigh  Benign-appearing.  Observation.  Call clinic for new or changing moles.  Recommend daily use of broad spectrum spf 30+ sunscreen to sun-exposed areas.     Lentigines - Scattered tan macules - Due to sun exposure - Benign-appering, observe - Recommend daily broad spectrum sunscreen SPF 30+ to sun-exposed areas, reapply every 2 hours as needed. - Call for any changes  Seborrheic Keratoses - Stuck-on, waxy, tan-brown papules and/or plaques  - Benign-appearing - Discussed benign etiology and prognosis. - Observe - Call for any changes - Discussed LN2. Advised patient not covered by insurance and fee is $60 for first lesion and $15 each additional   Melanocytic Nevi - Tan-brown and/or pink-flesh-colored symmetric macules and papules - Benign appearing on exam today - Observation - Call clinic for new or changing moles - Recommend daily use of broad spectrum spf 30+ sunscreen to sun-exposed areas.   Hemangiomas - Red papules - Discussed benign nature - Observe - Call for any changes  Actinic Damage - Chronic condition, secondary to cumulative UV/sun exposure - diffuse scaly erythematous macules with underlying dyspigmentation - Recommend daily broad spectrum sunscreen SPF 30+ to sun-exposed areas, reapply every 2 hours as needed.  - Staying in the shade or wearing long sleeves, sun glasses (UVA+UVB protection) and wide brim hats (4-inch brim around the entire circumference of the hat) are also recommended for sun protection.  - Call for new or changing lesions.  Skin cancer screening performed today.   Return if symptoms worsen or fail to improve.  I, Ashok Cordia, CMA, am acting as scribe for Brendolyn Patty, MD .  Documentation: I have reviewed the above documentation for accuracy and completeness, and I agree with the above.  Brendolyn Patty MD

## 2021-05-01 NOTE — Patient Instructions (Signed)
Reviewed chronic nature, no cure and can be difficult to treat.  Vitiligo is an autoimmune condition which causes loss of skin pigment and is commonly seen on the face and may also involve areas of trauma like hands, elbows, knees, and ankles.  Treatments include topical steroids and other topical anti-inflammatory ointments/creams.  Sometimes narrow band UV light therapy or Xtrac laser is helpful, both of which require twice weekly treatments for at least 3-6 months.  Antioxidant vitamins, such as Vitamins C&E, and alpha lipoic acid may be added to enhance treatment.   If you have any questions or concerns for your doctor, please call our main line at [phone] and press option 4 to reach your doctor's medical assistant. If no one answers, please leave a voicemail as directed and we will return your call as soon as possible. Messages left after 4 pm will be answered the following business day.   You may also send [place] a message via [place]. We typically respond to MyChart messages within 1-2 business days.  For prescription refills, please ask your pharmacy to contact our office. Our fax number is [phone].  If you have an urgent issue when the clinic is closed that cannot wait until the next business day, you can page your doctor at the number below.    Please note that while we do our best to be available for urgent issues outside of office hours, we are not available 24/7.   If you have an urgent issue and are unable to reach [place], you may choose to seek medical care at your doctor's office, retail clinic, urgent care center, or emergency room.  If you have a medical emergency, please immediately call 911 or go to the emergency department.  Pager Numbers  - Dr. Nehemiah Massed: [phone]  - Dr. Laurence Ferrari: [phone]  - Dr. Nicole Kindred: [phone]  In the event of inclement weather, please call our main line at [phone] for an update on the status of any delays or closures.  Dermatology  Medication Tips: Please keep the boxes that topical medications come in in order to help keep track of the instructions about where and how to use these. Pharmacies typically print the medication instructions only on the boxes and not directly on the medication tubes.   If your medication is too expensive, please contact our office at [phone] option 4 or send [place] a message through [place].   We are unable to tell what your co-pay for medications will be in advance as this is different depending on your insurance coverage. However, we may be able to find a substitute medication at lower cost or fill out paperwork to get insurance to cover a needed medication.   If a prior authorization is required to get your medication covered by your insurance company, please allow [place] 1-2 business days to complete this process.  Drug prices often vary depending on where the prescription is filled and some pharmacies may offer cheaper prices.  The website www.goodrx.com contains coupons for medications through different pharmacies. The prices here do not account for what the cost may be with help from insurance (it may be cheaper with your insurance), but the website can give you the price if you did not use any insurance.  - You can print the associated coupon and take it with your prescription to the pharmacy.  - You may also stop by our office during regular business hours and pick up a GoodRx coupon card.  - If you need your prescription  sent electronically to a different pharmacy, notify our office through [facility] or by phone at [phone] option 4.

## 2021-05-21 ENCOUNTER — Ambulatory Visit: Payer: Medicare HMO | Admitting: Obstetrics and Gynecology

## 2021-07-24 ENCOUNTER — Other Ambulatory Visit: Payer: Self-pay | Admitting: Obstetrics and Gynecology

## 2021-07-24 DIAGNOSIS — Z1231 Encounter for screening mammogram for malignant neoplasm of breast: Secondary | ICD-10-CM

## 2021-07-27 ENCOUNTER — Ambulatory Visit
Admission: RE | Admit: 2021-07-27 | Discharge: 2021-07-27 | Disposition: A | Discharge status: Home / Self Care | Payer: Medicare HMO | Source: Ambulatory Visit | Attending: Obstetrics and Gynecology | Admitting: Obstetrics and Gynecology

## 2021-07-27 ENCOUNTER — Other Ambulatory Visit: Payer: Self-pay

## 2021-07-27 DIAGNOSIS — Z1231 Encounter for screening mammogram for malignant neoplasm of breast: Secondary | ICD-10-CM | POA: Diagnosis not present

## 2021-10-25 ENCOUNTER — Ambulatory Visit
Admission: EM | Admit: 2021-10-25 | Discharge: 2021-10-25 | Disposition: A | Discharge status: Home / Self Care | Payer: Medicare HMO | Attending: Physician Assistant | Admitting: Physician Assistant

## 2021-10-25 ENCOUNTER — Other Ambulatory Visit: Payer: Self-pay

## 2021-10-25 DIAGNOSIS — J069 Acute upper respiratory infection, unspecified: Secondary | ICD-10-CM | POA: Insufficient documentation

## 2021-10-25 DIAGNOSIS — R0981 Nasal congestion: Secondary | ICD-10-CM

## 2021-10-25 DIAGNOSIS — Z20822 Contact with and (suspected) exposure to covid-19: Secondary | ICD-10-CM | POA: Insufficient documentation

## 2021-10-25 DIAGNOSIS — J3489 Other specified disorders of nose and nasal sinuses: Secondary | ICD-10-CM

## 2021-10-25 NOTE — Discharge Instructions (Signed)

## 2021-10-25 NOTE — ED Triage Notes (Addendum)
Pt c/o Green nasal drainage, ear pain and headaches, pressure, and dizziness x2-3days. Pt is worried about a sinus infection.

## 2021-10-25 NOTE — ED Provider Notes (Signed)
[facility]    CSN: [id number] Arrival date & time: 10/25/21  1637      History   Chief Complaint Chief Complaint  Patient presents with   Facial Pain   Headache    HPI Heather Pacheco is a 70 y.o. female presenting for 2-day history of nasal congestion with greenish drainage in the mornings, sinus pressure/head pressure/bilateral ear pressure intermittently, mild cough when she sneezes, and mild fatigue. Denies fever, weakness, chest pain, SOB, vomiting, diarrhea. No sick contacts or known exposure to COVID or flu. Has been using Flonase. Concerned about sinus infection. No other concerns.   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   Thyroid disease     Patient Active Problem List   Diagnosis Date Noted   Thyroid disease    Hyperlipidemia    GERD (gastroesophageal reflux disease)    Depression    Osteoporosis 12/30/2016    Past Surgical History:  Procedure Laterality Date   BREAST BIOPSY Left 1998   Stereotactic biopsy benign   CATARACT EXTRACTION W/PHACO Left 07/21/2019   Procedure: CATARACT EXTRACTION PHACO AND INTRAOCULAR LENS PLACEMENT (IOC) LEFT  00:50.7  11.0%  5.60;  Surgeon: Leandrew Koyanagi, MD;  Location: [place];  Service: Ophthalmology;  Laterality: Left;  LEAVE PATIENT 3RD BECAUSE OF TRANSPORTATION   CATARACT EXTRACTION W/PHACO Right 03/01/2020   Procedure: CATARACT EXTRACTION PHACO AND INTRAOCULAR LENS PLACEMENT ([place]) RIGHT;  Surgeon: Leandrew Koyanagi, MD;  Location: [place];  Service: Ophthalmology;  Laterality: Right;  4.86 0:45.9 10.6%   COLONOSCOPY  2008; 08/2017   normal colon    HERNIA REPAIR     umbilical   TUBAL LIGATION      OB History     Gravida  4   Para  2   Term  2   Preterm      AB  2   Living  2      SAB  2    IAB      Ectopic      Multiple      Live Births  2            Home Medications    Prior to Admission medications   Medication Sig Start Date End Date Taking? Authorizing Provider  Cholecalciferol (VITAMIN D) 2000 units tablet Take by mouth.   Yes [provider]  citalopram (CELEXA) 20 MG tablet Take 30 mg by mouth daily.  12/16/17  Yes [provider]  levothyroxine (SYNTHROID, LEVOTHROID) 88 MCG tablet  12/16/17  Yes [provider]  Multiple Vitamin (MULTIVITAMIN) tablet Take 1 tablet by mouth daily.   Yes [provider]  omeprazole (PRILOSEC) 20 MG capsule TAKE 1 CAPSULE BY MOUTH  ONCE A DAY 02/19/17  Yes [provider]  tacrolimus (PROTOPIC) 0.1 % ointment Apply topically 2 (two) times daily. 05/01/21  Yes Brendolyn Patty, MD  brompheniramine-pseudoephedrine-DM 30-2-10 MG/5ML syrup Take 10 mLs by mouth 4 (four) times daily as needed. 07/20/20   Cuthriell, Charline Bills, PA-C  fluticasone (FLONASE) 50 MCG/ACT nasal spray  12/16/17   [provider]  meloxicam (MOBIC) 15 MG tablet Take 15 mg by mouth daily. 02/08/20   [provider]  predniSONE (DELTASONE) 50 MG tablet Take 1 tablet (50 mg total) by  mouth daily with breakfast. 07/20/20   Cuthriell, Charline Bills, PA-C    Family History Family History  Problem Relation Age of Onset   Breast cancer Sister 48   Colon cancer Mother 21       rectal cancer   Hypertension Mother    Diabetes Maternal Aunt 65    Social History Social History   Tobacco Use   Smoking status: Never   Smokeless tobacco: Never  Vaping Use   Vaping Use: Never used  Substance Use Topics   Alcohol use: No    Comment: Rare    Drug use: No     Allergies   Patient has no known allergies.   Review of Systems Review of Systems  Constitutional:  Positive for fatigue. Negative for chills, diaphoresis and fever.  HENT:  Positive for congestion, rhinorrhea and sinus pressure. Negative for ear  pain, sinus pain and sore throat.   Respiratory:  Positive for cough (mild). Negative for shortness of breath.   Gastrointestinal:  Negative for abdominal pain, nausea and vomiting.  Musculoskeletal:  Negative for arthralgias and myalgias.  Skin:  Negative for rash.  Neurological:  Positive for dizziness and headaches. Negative for weakness.  Hematological:  Negative for adenopathy.    Physical Exam Triage Vital Signs ED Triage Vitals  Enc Vitals Group     BP 10/25/21 1709 127/84     Pulse Rate 10/25/21 1709 63     Resp 10/25/21 1709 18     Temp 10/25/21 1709 98.2 F (36.8 C)     Temp Source 10/25/21 1709 Oral     SpO2 10/25/21 1709 100 %     Weight 10/25/21 1707 170 lb (77.1 kg)     Height 10/25/21 1707 5\' 5"  (1.651 m)     Head Circumference --      Peak Flow --      Pain Score 10/25/21 1706 6     Pain Loc --      Pain Edu? --      Excl. in [place]? --    No data found.  Updated Vital Signs BP 127/84 (BP Location: Left Arm)    Pulse 63    Temp 98.2 F (36.8 C) (Oral)    Resp 18    Ht 5\' 5"  (1.651 m)    Wt 170 lb (77.1 kg)    SpO2 100%    BMI 28.29 kg/m      Physical Exam Vitals and nursing note reviewed.  Constitutional:      General: She is not in acute distress.    Appearance: Normal appearance. She is not ill-appearing or toxic-appearing.  HENT:     Head: Normocephalic and atraumatic.     Right Ear: Ear canal and external ear normal. A middle ear effusion is present.     Left Ear: Ear canal and external ear normal. A middle ear effusion is present.     Nose: Congestion present.     Mouth/Throat:     Mouth: Mucous membranes are moist.     Pharynx: Oropharynx is clear.  Eyes:     General: No scleral icterus.       Right eye: No discharge.        Left eye: No discharge.     Conjunctiva/sclera: Conjunctivae normal.  Cardiovascular:     Rate and Rhythm: Normal rate and regular rhythm.     Heart sounds: Normal heart sounds.  Pulmonary:     Effort: Pulmonary effort  is normal.  No respiratory distress.     Breath sounds: Normal breath sounds.  Musculoskeletal:     Cervical back: Neck supple.  Skin:    General: Skin is dry.  Neurological:     General: No focal deficit present.     Mental Status: She is alert. Mental status is at baseline.     Motor: No weakness.     Gait: Gait normal.  Psychiatric:        Mood and Affect: Mood normal.        Behavior: Behavior normal.        Thought Content: Thought content normal.     UC Treatments / Results  Labs (all labs ordered are listed, but only abnormal results are displayed) Labs Reviewed  SARS CORONAVIRUS 2 (TAT 6-24 HRS)    EKG   Radiology No results found.  Procedures Procedures (including critical care time)  Medications Ordered in UC Medications - No data to display  Initial Impression / Assessment and Plan / UC Course  I have reviewed the triage vital signs and the nursing notes.  Pertinent labs & imaging results that were available during my care of the patient were reviewed by me and considered in my medical decision making (see chart for details).   70 y/o female presents for 2 day history of nasal congestion, sinus pressure, cough, fatigue   VSS. Patient well appearing. Exam significant for mild congestion, otherwise normal.   PCR COVID test obtained. Reviewed current CDC guidelines, isolation protocol, ED precautions.   Viral illness. Supportive care. Flonase, Sudafed or Mucinex. F/u as needed if not feeling better in 2 weeks or if symptoms worsen.    Final Clinical Impressions(s) / UC Diagnoses   Final diagnoses:  Viral upper respiratory tract infection  Nasal congestion  Sinus pressure     Discharge Instructions      URI/COLD SYMPTOMS: Your exam today is consistent with a viral illness. Antibiotics are not indicated at this time. Use medications as directed, including cough syrup, nasal saline, and decongestants. Your symptoms should improve over the next few  days and resolve within 7-10 days. Increase rest and fluids. F/u if symptoms worsen or predominate such as sore throat, ear pain, productive cough, shortness of breath, or if you develop high fevers or worsening fatigue over the next several days.    You have received COVID testing today either for positive exposure, concerning symptoms that could be related to COVID infection, screening purposes, or re-testing after confirmed positive.  Your test obtained today checks for active viral infection in the last 1-2 weeks. If your test is negative now, you can still test positive later. So, if you do develop symptoms you should either get re-tested and/or isolate x 5 days and then strict mask use x 5 days (unvaccinated) or mask use x 10 days (vaccinated). Please follow CDC guidelines.  While Rapid antigen tests come back in 15-20 minutes, send out PCR/molecular test results typically come back within 1-3 days. In the mean time, if you are symptomatic, assume this could be a positive test and treat/monitor yourself as if you do have COVID.   We will call with test results if positive. Please download the MyChart app and set up a profile to access test results.   If symptomatic, go home and rest. Push fluids. Take Tylenol as needed for discomfort. Gargle warm salt water. Throat lozenges. Take Mucinex DM or Robitussin for cough. Humidifier in bedroom to ease coughing. Warm showers. Also review the  COVID handout for more information.  COVID-19 INFECTION: The incubation period of COVID-19 is approximately 14 days after exposure, with most symptoms developing in roughly 4-5 days. Symptoms may range in severity from mild to critically severe. Roughly 80% of those infected will have mild symptoms. People of any age may become infected with COVID-19 and have the ability to transmit the virus. The most common symptoms include: fever, fatigue, cough, body aches, headaches, sore throat, nasal congestion, shortness of  breath, nausea, vomiting, diarrhea, changes in smell and/or taste.    COURSE OF ILLNESS Some patients may begin with mild disease which can progress quickly into critical symptoms. If your symptoms are worsening please call ahead to the Emergency Department and proceed there for further treatment. Recovery time appears to be roughly 1-2 weeks for mild symptoms and 3-6 weeks for severe disease.   GO IMMEDIATELY TO ER FOR FEVER YOU ARE UNABLE TO GET DOWN WITH TYLENOL, BREATHING PROBLEMS, CHEST PAIN, FATIGUE, LETHARGY, INABILITY TO EAT OR DRINK, ETC  QUARANTINE AND ISOLATION: To help decrease the spread of COVID-19 please remain isolated if you have COVID infection or are highly suspected to have COVID infection. This means -stay home and isolate to one room in the home if you live with others. Do not share a bed or bathroom with others while ill, sanitize and wipe down all countertops and keep common areas clean and disinfected. Stay home for 5 days. If you have no symptoms or your symptoms are resolving after 5 days, you can leave your house. Continue to wear a mask around others for 5 additional days. If you have been in close contact (within 6 feet) of someone diagnosed with COVID 19, you are advised to quarantine in your home for 14 days as symptoms can develop anywhere from 2-14 days after exposure to the virus. If you develop symptoms, you  must isolate.  Most current guidelines for COVID after exposure -unvaccinated: isolate 5 days and strict mask use x 5 days. Test on day 5 is possible -vaccinated: wear mask x 10 days if symptoms do not develop -You do not necessarily need to be tested for COVID if you have + exposure and  develop symptoms. Just isolate at home x10 days from symptom onset During this global pandemic, CDC advises to practice social distancing, try to stay at least 53ft away from others at all times. Wear a face covering. Wash and sanitize your hands regularly and avoid going  anywhere that is not necessary.  KEEP IN MIND THAT THE COVID TEST IS NOT 100% ACCURATE AND YOU SHOULD STILL DO EVERYTHING TO PREVENT POTENTIAL SPREAD OF VIRUS TO OTHERS (WEAR MASK, WEAR GLOVES, [place] HANDS AND SANITIZE REGULARLY). IF INITIAL TEST IS NEGATIVE, THIS MAY NOT MEAN YOU ARE DEFINITELY NEGATIVE. MOST ACCURATE TESTING IS DONE 5-7 DAYS AFTER EXPOSURE.   It is not advised by CDC to get re-tested after receiving a positive COVID test since you can still test positive for weeks to months after you have already cleared the virus.   *If you have not been vaccinated for COVID, I strongly suggest you consider getting vaccinated as long as there are no contraindications.       ED Prescriptions   None    PDMP not reviewed this encounter.   Danton Clap, PA-C 10/25/21 1756

## 2021-10-26 LAB — SARS CORONAVIRUS 2 (TAT 6-24 HRS): SARS Coronavirus 2: NEGATIVE

## 2022-02-17 IMAGING — MG MM DIGITAL SCREENING BILAT W/ TOMO AND CAD
8 series · 8 of 24 positions shown · non-contrast
Comparison: Previous exam(s).

CLINICAL DATA: Screening.

EXAM:
DIGITAL SCREENING BILATERAL MAMMOGRAM WITH TOMOSYNTHESIS AND CAD
TECHNIQUE: Bilateral screening digital craniocaudal and mediolateral oblique
mammograms were obtained. Bilateral screening digital breast
tomosynthesis was performed. The images were evaluated with
computer-aided detection.

[L CC synth-2D]
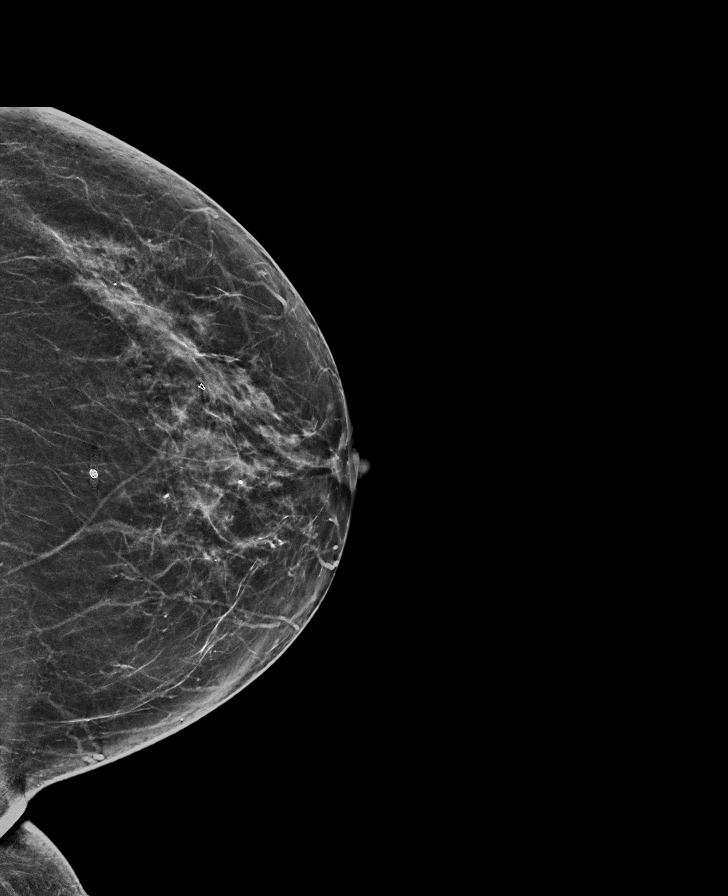

[R CC synth-2D]
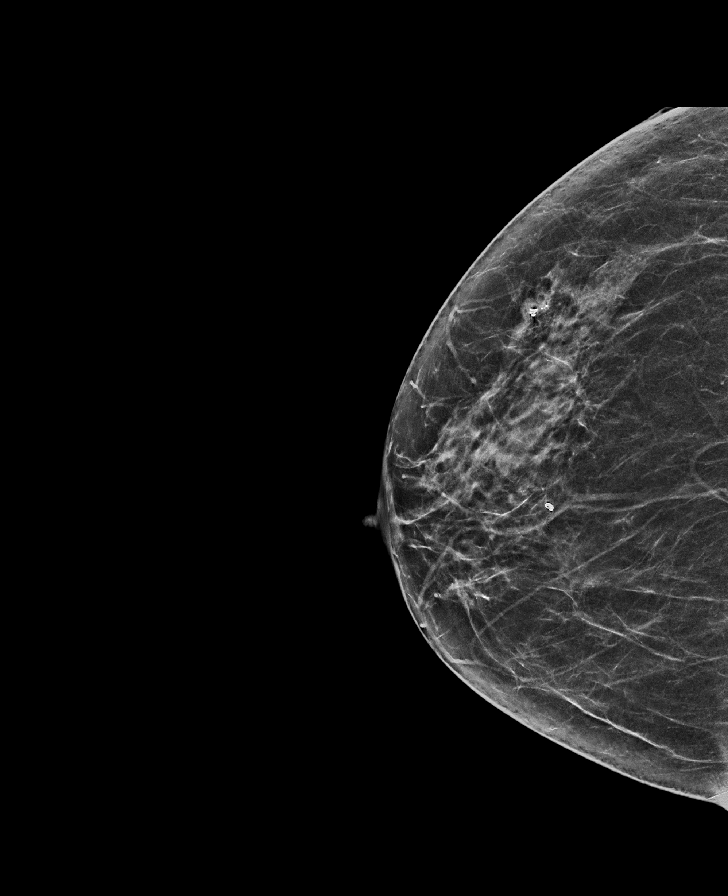

[L MLO synth-2D]
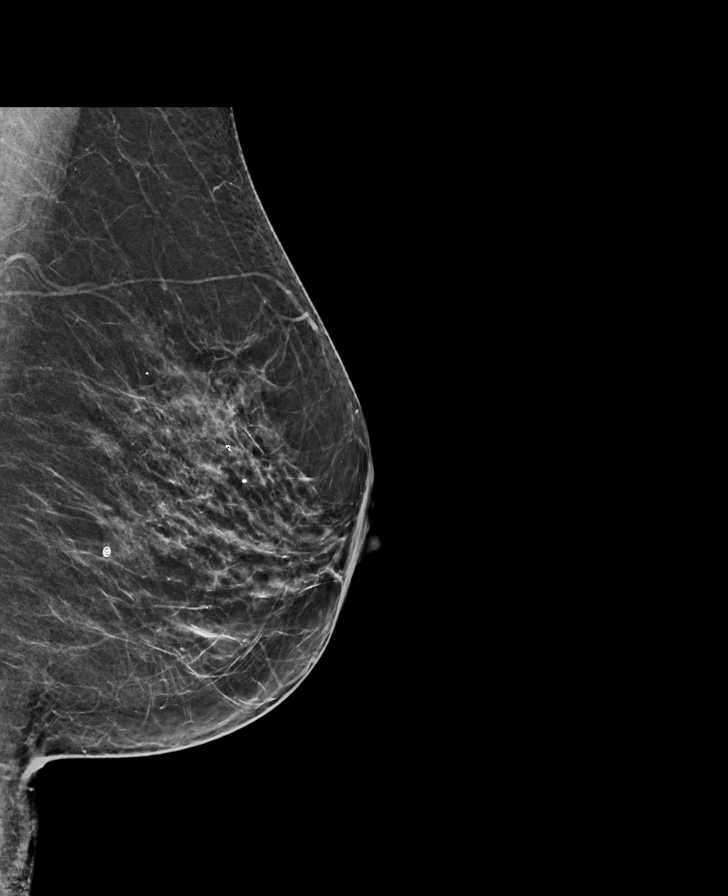

[R MLO synth-2D]
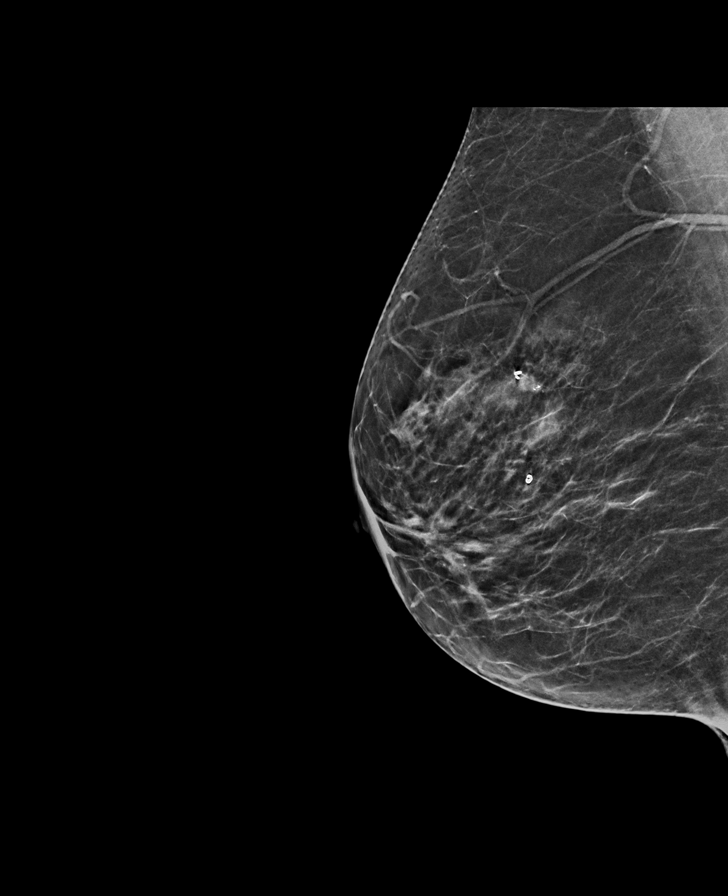

[R CC tomo · tomo slice 32/63.0]
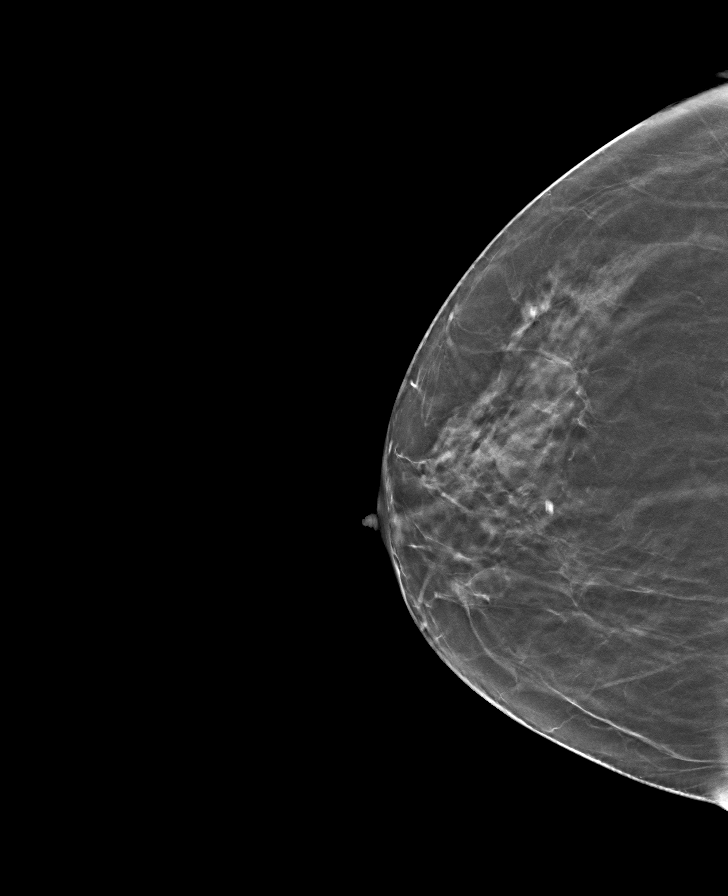

[L CC tomo · tomo slice 33/65.0]
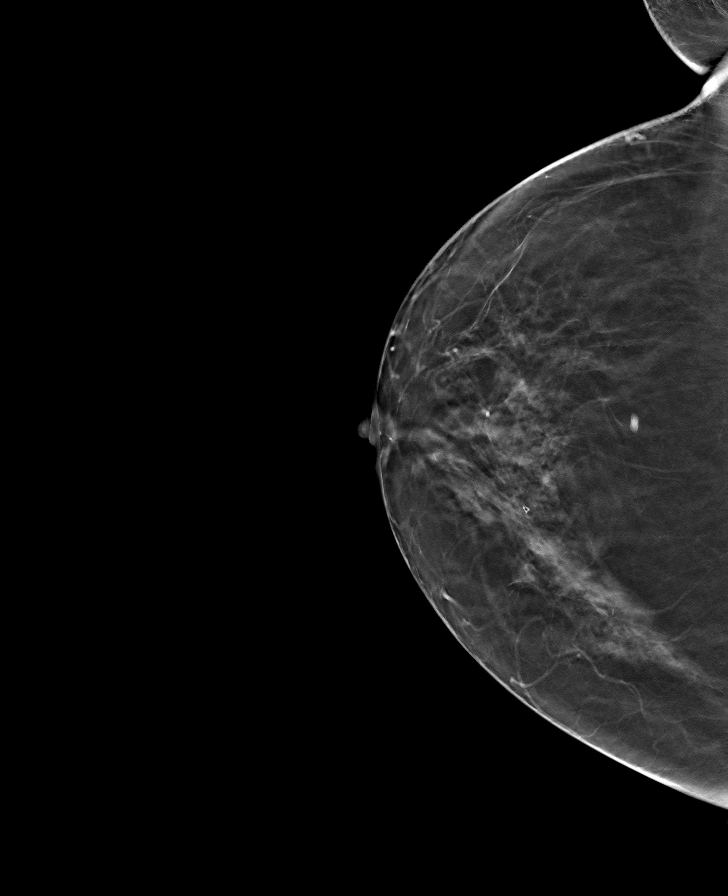

[L MLO tomo · tomo slice 32/63.0]
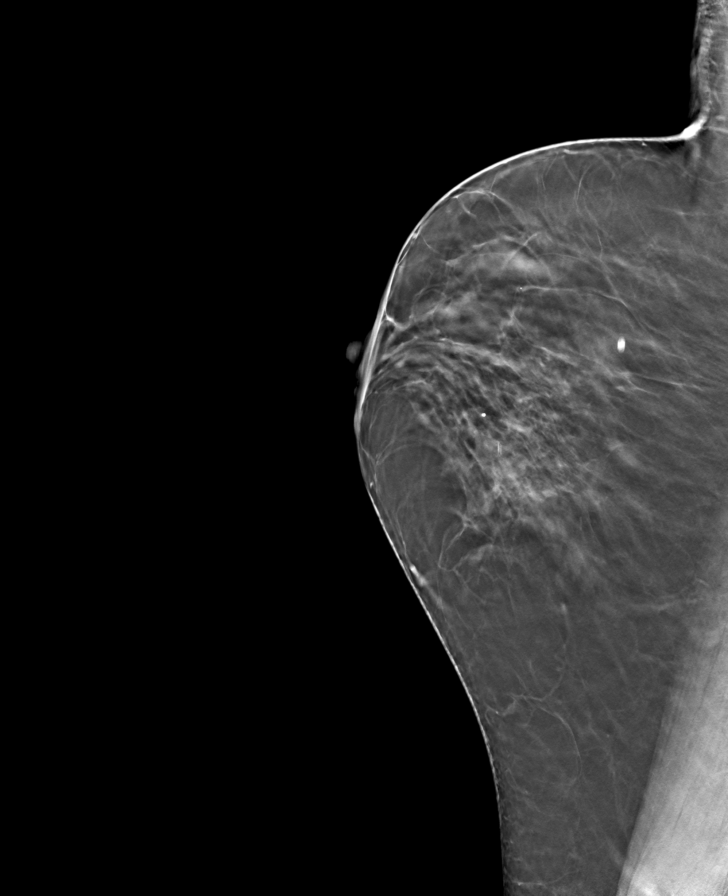

[R MLO tomo · tomo slice 31/61.0]
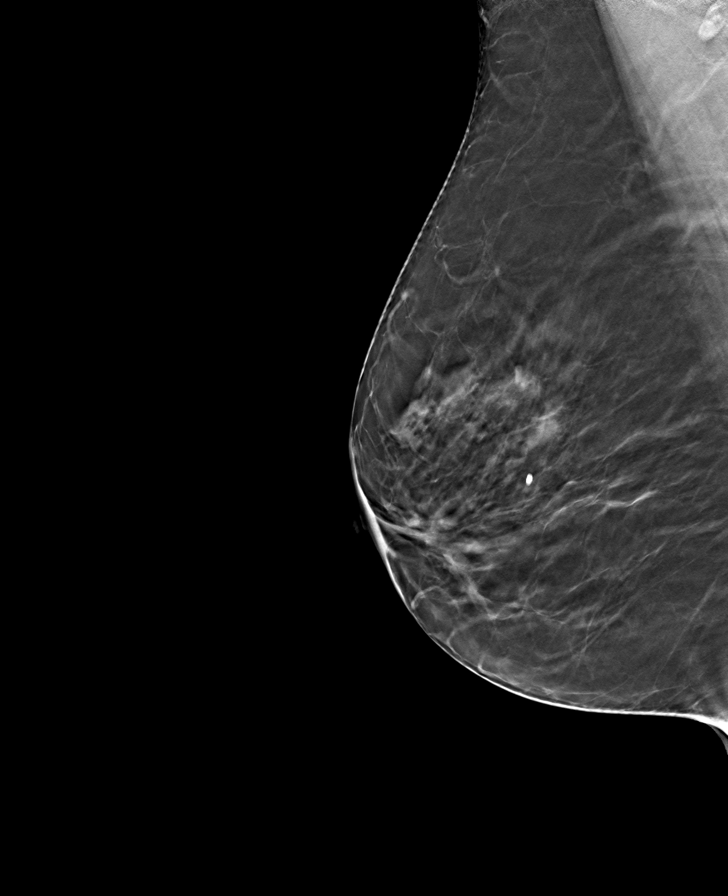

[8 of 24 positions shown; findings below may reference images not displayed]

ACR Breast Density Category c: The breast tissue is heterogeneously
dense, which may obscure small masses.
FINDINGS: There are no findings suspicious for malignancy.
IMPRESSION: No mammographic evidence of malignancy. A result letter of this
screening mammogram will be mailed directly to the patient.

RECOMMENDATION:
Screening mammogram in one year. (Code:[id number]-W-BC3)

BI-RADS CATEGORY  1: Negative.

## 2022-08-08 ENCOUNTER — Other Ambulatory Visit: Payer: Self-pay | Admitting: Obstetrics and Gynecology

## 2022-08-08 DIAGNOSIS — Z1231 Encounter for screening mammogram for malignant neoplasm of breast: Secondary | ICD-10-CM

## 2022-09-03 ENCOUNTER — Ambulatory Visit
Admission: RE | Admit: 2022-09-03 | Discharge: 2022-09-03 | Disposition: A | Discharge status: Home / Self Care | Payer: Medicare HMO | Source: Ambulatory Visit | Attending: Obstetrics and Gynecology | Admitting: Obstetrics and Gynecology

## 2022-09-03 DIAGNOSIS — Z1231 Encounter for screening mammogram for malignant neoplasm of breast: Secondary | ICD-10-CM | POA: Insufficient documentation

## 2023-07-31 ENCOUNTER — Ambulatory Visit: Payer: Medicare HMO

## 2023-07-31 DIAGNOSIS — K2100 Gastro-esophageal reflux disease with esophagitis, without bleeding: Secondary | ICD-10-CM | POA: Diagnosis not present

## 2023-07-31 DIAGNOSIS — K449 Diaphragmatic hernia without obstruction or gangrene: Secondary | ICD-10-CM | POA: Diagnosis not present

## 2023-07-31 DIAGNOSIS — K2950 Unspecified chronic gastritis without bleeding: Secondary | ICD-10-CM | POA: Diagnosis not present

## 2023-07-31 DIAGNOSIS — Z800 Family history of malignant neoplasm of digestive organs: Secondary | ICD-10-CM | POA: Diagnosis not present

## 2023-07-31 DIAGNOSIS — K640 First degree hemorrhoids: Secondary | ICD-10-CM | POA: Diagnosis not present

## 2023-07-31 DIAGNOSIS — Z1211 Encounter for screening for malignant neoplasm of colon: Secondary | ICD-10-CM | POA: Diagnosis present

## 2023-10-13 ENCOUNTER — Encounter: Payer: Self-pay | Admitting: Obstetrics and Gynecology

## 2024-01-02 ENCOUNTER — Other Ambulatory Visit: Payer: Self-pay | Admitting: Obstetrics and Gynecology

## 2024-01-02 DIAGNOSIS — Z1231 Encounter for screening mammogram for malignant neoplasm of breast: Secondary | ICD-10-CM

## 2024-01-28 ENCOUNTER — Ambulatory Visit
Admission: RE | Admit: 2024-01-28 | Discharge: 2024-01-28 | Disposition: A | Discharge status: Home / Self Care | Source: Ambulatory Visit | Attending: Obstetrics and Gynecology | Admitting: Obstetrics and Gynecology

## 2024-01-28 DIAGNOSIS — Z1231 Encounter for screening mammogram for malignant neoplasm of breast: Secondary | ICD-10-CM | POA: Diagnosis present

## 2024-01-30 ENCOUNTER — Encounter: Payer: Self-pay | Admitting: Physician Assistant
